# Patient Record
Sex: Male | Born: 1965
Health system: Southern US, Community
[De-identification: ages and names within clinical notes are randomized; demographics above are authoritative.]

## PROBLEM LIST (undated history)

## (undated) DIAGNOSIS — K219 Gastro-esophageal reflux disease without esophagitis: Secondary | ICD-10-CM

## (undated) DIAGNOSIS — Z8669 Personal history of other diseases of the nervous system and sense organs: Secondary | ICD-10-CM

## (undated) DIAGNOSIS — R519 Headache, unspecified: Secondary | ICD-10-CM

## (undated) DIAGNOSIS — R51 Headache: Principal | ICD-10-CM

## (undated) DIAGNOSIS — R011 Cardiac murmur, unspecified: Secondary | ICD-10-CM

## (undated) DIAGNOSIS — Z87891 Personal history of nicotine dependence: Secondary | ICD-10-CM

## (undated) HISTORY — DX: Cardiac murmur, unspecified: R01.1

## (undated) HISTORY — DX: Personal history of nicotine dependence: Z87.891

## (undated) HISTORY — DX: Headache: R51

## (undated) HISTORY — DX: Gastro-esophageal reflux disease without esophagitis: K21.9

## (undated) HISTORY — DX: Personal history of other diseases of the nervous system and sense organs: Z86.69

## (undated) HISTORY — DX: Headache, unspecified: R51.9

## (undated) HISTORY — PX: WISDOM TOOTH EXTRACTION: SHX21

## (undated) HISTORY — PX: TONSILLECTOMY: SUR1361

---

## 2003-11-14 ENCOUNTER — Emergency Department (HOSPITAL_COMMUNITY): Admission: EM | Admit: 2003-11-14 | Discharge: 2003-11-14 | Payer: Self-pay | Admitting: Emergency Medicine

## 2003-11-27 ENCOUNTER — Emergency Department (HOSPITAL_COMMUNITY): Admission: EM | Admit: 2003-11-27 | Discharge: 2003-11-27 | Payer: Self-pay | Admitting: Emergency Medicine

## 2011-06-23 ENCOUNTER — Encounter: Payer: Self-pay | Admitting: Family Medicine

## 2011-06-23 ENCOUNTER — Ambulatory Visit (INDEPENDENT_AMBULATORY_CARE_PROVIDER_SITE_OTHER): Payer: BC Managed Care – PPO | Admitting: Family Medicine

## 2011-06-23 VITALS — BP 112/70 | HR 80 | Temp 98.1°F | Ht 64.0 in | Wt 125.5 lb

## 2011-06-23 DIAGNOSIS — R519 Headache, unspecified: Secondary | ICD-10-CM

## 2011-06-23 DIAGNOSIS — J3489 Other specified disorders of nose and nasal sinuses: Secondary | ICD-10-CM

## 2011-06-23 DIAGNOSIS — R51 Headache: Secondary | ICD-10-CM

## 2011-06-23 DIAGNOSIS — R0981 Nasal congestion: Secondary | ICD-10-CM | POA: Insufficient documentation

## 2011-06-23 DIAGNOSIS — F17200 Nicotine dependence, unspecified, uncomplicated: Secondary | ICD-10-CM

## 2011-06-23 DIAGNOSIS — Z87891 Personal history of nicotine dependence: Secondary | ICD-10-CM | POA: Insufficient documentation

## 2011-06-23 DIAGNOSIS — G43909 Migraine, unspecified, not intractable, without status migrainosus: Secondary | ICD-10-CM | POA: Insufficient documentation

## 2011-06-23 DIAGNOSIS — Z23 Encounter for immunization: Secondary | ICD-10-CM

## 2011-06-23 MED ORDER — FLUTICASONE PROPIONATE 50 MCG/ACT NA SUSP: 2.0000 | Freq: Every day | NASAL | Status: DC

## 2011-06-23 NOTE — Assessment & Plan Note (Signed)
Neurological exam benign today. Anticipate sinus congestion component. Start INS and monitor headaches. Possible allergic rhinitis component (cats, cockroach)

## 2011-06-23 NOTE — Assessment & Plan Note (Signed)
Discussed risks of smoking and encouraged cessation. Contemplative.

## 2011-06-23 NOTE — Patient Instructions (Addendum)
Tdap (tetanus) and flu shot today. Let me know if headaches returning.  I do think they have to do with sinuses and allergies. Start flonase daily to see if we can help with these symptoms. Let me know if questions or concerns. You need to quit smoking!  Look into quitlinenc.com resources. Return if headaches not improving or any concerns or if you want to discuss smoking cessation.

## 2011-06-23 NOTE — Progress Notes (Signed)
Subjective:    Patient ID: Darren Clark, male    DOB: August 30, 1965, 45 y.o.   MRN: [id number]  HPI CC: new pt establish  Longstanding h/o sinus problems.  Prior lived in [place], then recently moved into friend's house, cockroaches there.  Severe headaches initially which led to vomiting, seemed to happen on weekends only (spent more time at work on weekdays).  Associated with nausea/vomiting and photo/phonophobia.  This happened for 3-4 wkends.  Seen by chiropractor who prescribed amoxicillin and nasal spray for sinus pressure.  This has significantly improved.  Headache starts superior to nasal bridge, then travels bilateral frontal.  Described as dull ache.  May have had seldom migraines when growing up, in 48s.    + RN, mild congestion, some PNDrainage now improved.  No sneezing, itchy eyes and watery eyes.  Tried allegra D in past, caused worsening congestion so stopped.  No fevers/chills, vision changes, loss of vision, hearing changes, confusion, slurred speech, paresthesias, weakness.  No recent weight changes, NS.  No coughing.  Uses goody powders prn.  Smokes 2ppd, has smoked since age 67yo.  This is decrease from prior.  Preventative: No recent CPE Tetanus - possibly 1999?  Requests today Tdap. Flu - requests today.  Medications and allergies reviewed and updated in chart.  Past histories reviewed and updated if relevant as below. There is no problem list on file for this patient.  Past Medical History  Diagnosis Date  . Generalized headaches     worsening  . Heart murmur     history  . History of migraines   . Smoker    Past Surgical History  Procedure Date  . Tonsillectomy   . Wisdom tooth extraction    History  Substance Use Topics  . Smoking status: Current Everyday Smoker -- 2.0 packs/day    Types: Cigarettes  . Smokeless tobacco: Never Used  . Alcohol Use: No   Family History  Problem Relation Age of Onset  . Cancer Mother   . Stroke Father   .  Coronary artery disease Neg Hx   . Diabetes Neg Hx    No Known Allergies No current outpatient prescriptions on file prior to visit.   Review of Systems  Constitutional: Negative for fever, chills, activity change, appetite change, fatigue and unexpected weight change.  HENT: Negative for hearing loss and neck pain.   Eyes: Negative for visual disturbance.  Respiratory: Positive for cough. Negative for chest tightness, shortness of breath and wheezing.   Cardiovascular: Negative for chest pain, palpitations and leg swelling.  Gastrointestinal: Negative for nausea, vomiting, abdominal pain, diarrhea, constipation, blood in stool and abdominal distention.  Genitourinary: Negative for hematuria and difficulty urinating.  Musculoskeletal: Negative for myalgias and arthralgias.  Skin: Negative for rash.  Neurological: Positive for headaches. Negative for dizziness, seizures and syncope.  Hematological: Does not bruise/bleed easily.  Psychiatric/Behavioral: Negative for dysphoric mood. The patient is not nervous/anxious.       Objective:   Physical Exam  Nursing note and vitals reviewed. Constitutional: He is oriented to person, place, and time. He appears well-developed and well-nourished. No distress.  HENT:  Head: Normocephalic and atraumatic.  Right Ear: Hearing, tympanic membrane, external ear and ear canal normal.  Left Ear: Hearing, tympanic membrane, external ear and ear canal normal.  Nose: Mucosal edema present. No rhinorrhea. Right sinus exhibits no maxillary sinus tenderness and no frontal sinus tenderness. Left sinus exhibits no maxillary sinus tenderness and no frontal sinus tenderness.  Mouth/Throat: Uvula is  midline, oropharynx is clear and moist and mucous membranes are normal. No oropharyngeal exudate, posterior oropharyngeal edema, posterior oropharyngeal erythema or tonsillar abscesses.       L nare completely obstructed from deviated septum  Eyes: Conjunctivae and EOM  are normal. Pupils are equal, round, and reactive to light. No scleral icterus.  Neck: Normal range of motion. Neck supple.  Cardiovascular: Normal rate, regular rhythm, normal heart sounds and intact distal pulses.   No murmur heard. Pulses:      Radial pulses are 2+ on the right side, and 2+ on the left side.  Pulmonary/Chest: Effort normal and breath sounds normal. No respiratory distress. He has no wheezes. He has no rales.  Abdominal: Soft. Bowel sounds are normal. He exhibits no distension and no mass. There is no tenderness. There is no rebound and no guarding.  Musculoskeletal: Normal range of motion. He exhibits no edema.  Lymphadenopathy:    He has no cervical adenopathy.  Neurological: He is alert and oriented to person, place, and time. He has normal strength. No cranial nerve deficit or sensory deficit. Coordination and gait normal.       station and gait intact  Skin: Skin is warm and dry. No rash noted.  Psychiatric: He has a normal mood and affect. His behavior is normal. Judgment and thought content normal.      Assessment & Plan:

## 2011-06-23 NOTE — Assessment & Plan Note (Signed)
Significant left sided obstruction of nare, anticipate due to septal deviation. Pt not interested in surgery.   Treat with INS for now, update me if not improving or if decides for ENT eval.

## 2011-07-03 ENCOUNTER — Other Ambulatory Visit: Payer: Self-pay | Admitting: Family Medicine

## 2011-07-03 ENCOUNTER — Ambulatory Visit: Payer: Self-pay | Admitting: Internal Medicine

## 2011-07-03 DIAGNOSIS — Z0000 Encounter for general adult medical examination without abnormal findings: Secondary | ICD-10-CM

## 2011-07-09 ENCOUNTER — Other Ambulatory Visit (INDEPENDENT_AMBULATORY_CARE_PROVIDER_SITE_OTHER): Payer: BC Managed Care – PPO

## 2011-07-09 DIAGNOSIS — Z0000 Encounter for general adult medical examination without abnormal findings: Secondary | ICD-10-CM

## 2011-07-09 LAB — COMPREHENSIVE METABOLIC PANEL
AST: 19 U/L (ref 0–37)
Albumin: 4 g/dL (ref 3.5–5.2)
Chloride: 110 mEq/L (ref 96–112)
GFR: 124.92 mL/min (ref >60.00)
Total Bilirubin: 0.2 mg/dL — ABNORMAL LOW (ref 0.3–1.2)
Total Protein: 6.5 g/dL (ref 6.0–8.3)

## 2011-07-09 LAB — CBC WITH DIFFERENTIAL/PLATELET
Basophils Absolute: 0.1 10*3/uL (ref 0.0–0.1)
Hemoglobin: 12.9 g/dL — ABNORMAL LOW (ref 13.0–17.0)
Lymphs Abs: 2.4 10*3/uL (ref 0.7–4.0)
Monocytes Absolute: 0.3 10*3/uL (ref 0.1–1.0)
Monocytes Relative: 4.2 % (ref 3.0–12.0)
Neutro Abs: 4.8 10*3/uL (ref 1.4–7.7)
Neutrophils Relative %: 56.9 % (ref 43.0–77.0)
Platelets: 248 10*3/uL (ref 150.0–400.0)

## 2011-07-09 LAB — LIPID PANEL
HDL: 36.6 mg/dL — ABNORMAL LOW (ref >39.00)
VLDL: 41.2 mg/dL — ABNORMAL HIGH (ref 0.0–40.0)

## 2011-07-10 LAB — LDL CHOLESTEROL, DIRECT: Direct LDL: 102.4 mg/dL

## 2011-07-14 ENCOUNTER — Encounter: Payer: Self-pay | Admitting: Radiology

## 2012-01-26 ENCOUNTER — Emergency Department (INDEPENDENT_AMBULATORY_CARE_PROVIDER_SITE_OTHER)
Admission: EM | Admit: 2012-01-26 | Discharge: 2012-01-26 | Disposition: A | Discharge status: Home / Self Care | Payer: BC Managed Care – PPO | Source: Home / Self Care | Attending: Family Medicine | Admitting: Family Medicine

## 2012-01-26 ENCOUNTER — Encounter (HOSPITAL_COMMUNITY): Payer: Self-pay

## 2012-01-26 DIAGNOSIS — J019 Acute sinusitis, unspecified: Secondary | ICD-10-CM

## 2012-01-26 MED ORDER — ACETAMINOPHEN-CODEINE #3 300-30 MG PO TABS: 1.0000 | ORAL_TABLET | Freq: Four times a day (QID) | ORAL | Status: AC | PRN

## 2012-01-26 MED ORDER — FLUTICASONE PROPIONATE 50 MCG/ACT NA SUSP: 2.0000 | Freq: Every day | NASAL | Status: DC

## 2012-01-26 MED ORDER — AMOXICILLIN 500 MG PO CAPS: 500.0000 mg | ORAL_CAPSULE | Freq: Three times a day (TID) | ORAL | Status: AC

## 2012-01-26 MED ORDER — PREDNISONE 20 MG PO TABS: ORAL_TABLET | ORAL | Status: AC

## 2012-01-26 MED ORDER — IBUPROFEN 600 MG PO TABS: 600.0000 mg | ORAL_TABLET | Freq: Three times a day (TID) | ORAL | Status: AC | PRN

## 2012-01-26 NOTE — ED Provider Notes (Signed)
History     CSN: [id number]  Arrival date & time 01/26/12  1357   First MD Initiated Contact with Patient 01/26/12 1418      Chief Complaint  Patient presents with  . Facial Pain    (Consider location/radiation/quality/duration/timing/severity/associated sxs/prior treatment) HPI Comments: 46 year old smoker male with history of recurrent sinusitis. Here complaining of nasal congestion, sinus pressure and headache for 1 week. Nasal secretions started to turn yellow in the last 2 days. Denies cough, shortness of breath, chest pain, fever or chills. Has used Flonase in the past. Has not been seen by ENT specialist in the past.   Past Medical History  Diagnosis Date  . Generalized headaches   . Heart murmur     history  . History of migraines   . Smoker     Past Surgical History  Procedure Date  . Tonsillectomy   . Wisdom tooth extraction     Family History  Problem Relation Age of Onset  . Cancer Mother   . Stroke Father   . Coronary artery disease Neg Hx   . Diabetes Neg Hx     History  Substance Use Topics  . Smoking status: Current Everyday Smoker -- 2.0 packs/day    Types: Cigarettes  . Smokeless tobacco: Never Used  . Alcohol Use: No      Review of Systems  Constitutional: Positive for appetite change. Negative for fever and chills.  HENT: Positive for congestion, rhinorrhea and sinus pressure. Negative for sore throat, facial swelling, trouble swallowing, neck pain and dental problem.   Respiratory: Negative for cough, shortness of breath and wheezing.   Cardiovascular: Negative for chest pain, palpitations and leg swelling.  Skin: Negative for rash.  Neurological: Positive for headaches.    Allergies  Review of patient's allergies indicates no known allergies.  Home Medications   Current Outpatient Rx  Name Route Sig Dispense Refill  . ACETAMINOPHEN-CODEINE #3 300-30 MG PO TABS Oral Take 1-2 tablets by mouth every 6 (six) hours as needed for pain.  15 tablet 0  . AMOXICILLIN 500 MG PO CAPS Oral Take 1 capsule (500 mg total) by mouth 3 (three) times daily. 30 capsule 0  . GOODY HEADACHE PO Oral Take by mouth as needed.      Marland Kitchen FLUTICASONE PROPIONATE 50 MCG/ACT NA SUSP Nasal Place 2 sprays into the nose daily. 16 g 0  . IBUPROFEN 600 MG PO TABS Oral Take 1 tablet (600 mg total) by mouth every 8 (eight) hours as needed for pain. Take with food 20 tablet 0  . PREDNISONE 20 MG PO TABS  2 tabs by mouth daily for 5 days 10 tablet 0    BP 127/83  Pulse 76  Temp 97.8 F (36.6 C) (Oral)  Resp 16  SpO2 98%  Physical Exam  Nursing note and vitals reviewed. Constitutional: He is oriented to person, place, and time. He appears well-developed and well-nourished. No distress.  HENT:  Head: Normocephalic and atraumatic.  Right Ear: External ear normal.  Left Ear: External ear normal.       Nasal Congestion with erythema and swelling of nasal turbinates, septal deviation with narrowing and almost complete occlussion of left nasal passage. Yellow nasal discharge. Reported sinus tenderness in both maxillary areas. mild pharyngeal erythema no exudates. No uvula deviation. No trismus. TM's normal.   Eyes: Conjunctivae and EOM are normal. Pupils are equal, round, and reactive to light.  Neck: Normal range of motion. Neck supple.  Cardiovascular: Normal rate,  regular rhythm and normal heart sounds.   Pulmonary/Chest: Effort normal and breath sounds normal. He has no wheezes. He has no rales.  Lymphadenopathy:    He has no cervical adenopathy.  Neurological: He is alert and oriented to person, place, and time.  Skin: No rash noted.    ED Course  Procedures (including critical care time)  Labs Reviewed - No data to display No results found.   1. Sinusitis acute       MDM  Treated with prednisone, amoxicillin, ibuprofen and Tylenol No. 3. Refilled Flonase. ENT referral as needed.        Sharin Grave, MD 01/28/12 [phone]

## 2012-01-26 NOTE — ED Notes (Signed)
C/o "another sinus infection"; facial pain and pressure

## 2012-07-21 DIAGNOSIS — Z87891 Personal history of nicotine dependence: Secondary | ICD-10-CM

## 2012-07-21 HISTORY — DX: Personal history of nicotine dependence: Z87.891

## 2012-08-03 ENCOUNTER — Ambulatory Visit (INDEPENDENT_AMBULATORY_CARE_PROVIDER_SITE_OTHER): Payer: BC Managed Care – PPO | Admitting: Family Medicine

## 2012-08-03 ENCOUNTER — Encounter: Payer: Self-pay | Admitting: Family Medicine

## 2012-08-03 ENCOUNTER — Ambulatory Visit: Payer: BC Managed Care – PPO | Admitting: Family Medicine

## 2012-08-03 VITALS — BP 110/70 | HR 97 | Temp 98.1°F | Wt 134.0 lb

## 2012-08-03 DIAGNOSIS — J111 Influenza due to unidentified influenza virus with other respiratory manifestations: Secondary | ICD-10-CM | POA: Insufficient documentation

## 2012-08-03 MED ORDER — GUAIFENESIN-CODEINE 100-10 MG/5ML PO SYRP: 5.0000 mL | ORAL_SOLUTION | Freq: Every evening | ORAL | Status: DC | PRN

## 2012-08-03 NOTE — Patient Instructions (Signed)
Out of work until fever free for 24 hours. I do think you had flu. Treat with cough medicine for night time and lots of fluid and rest. Watch for fever >101 or worsening productive cough.  Influenza, Adult Influenza ("the flu") is a viral infection of the respiratory tract. It occurs more often in winter months because people spend more time in close contact with one another. Influenza can make you feel very sick. Influenza easily spreads from person to person (contagious). CAUSES  Influenza is caused by a virus that infects the respiratory tract. You can catch the virus by breathing in droplets from an infected person's cough or sneeze. You can also catch the virus by touching something that was recently contaminated with the virus and then touching your mouth, nose, or eyes. SYMPTOMS  Symptoms typically last 4 to 10 days and may include:  Fever.  Chills.  Headache, body aches, and muscle aches.  Sore throat.  Chest discomfort and cough.  Poor appetite.  Weakness or feeling tired.  Dizziness.  Nausea or vomiting. DIAGNOSIS  Diagnosis of influenza is often made based on your history and a physical exam. A nose or throat swab test can be done to confirm the diagnosis. RISKS AND COMPLICATIONS You may be at risk for a more severe case of influenza if you smoke cigarettes, have diabetes, have chronic heart disease (such as heart failure) or lung disease (such as asthma), or if you have a weakened immune system. Elderly people and pregnant women are also at risk for more serious infections. The most common complication of influenza is a lung infection (pneumonia). Sometimes, this complication can require emergency medical care and may be life-threatening. PREVENTION  An annual influenza vaccination (flu shot) is the best way to avoid getting influenza. An annual flu shot is now routinely recommended for all adults in the [place] TREATMENT  In mild cases, influenza goes away on its own.  Treatment is directed at relieving symptoms. For more severe cases, your caregiver may prescribe antiviral medicines to shorten the sickness. Antibiotic medicines are not effective, because the infection is caused by a virus, not by bacteria. HOME CARE INSTRUCTIONS  Only take over-the-counter or prescription medicines for pain, discomfort, or fever as directed by your caregiver.  Use a cool mist humidifier to make breathing easier.  Get plenty of rest until your temperature returns to normal. This usually takes 3 to 4 days.  Drink enough fluids to keep your urine clear or pale yellow.  Cover your mouth and nose when coughing or sneezing, and wash your hands well to avoid spreading the virus.  Stay home from work or school until your fever has been gone for at least 1 full day. SEEK MEDICAL CARE IF:   You have chest pain or a deep cough that worsens or produces more mucus.  You have nausea, vomiting, or diarrhea. SEEK IMMEDIATE MEDICAL CARE IF:   You have difficulty breathing, shortness of breath, or your skin or nails turn bluish.  You have severe neck pain or stiffness.  You have a severe headache, facial pain, or earache.  You have a worsening or recurring fever.  You have nausea or vomiting that cannot be controlled. MAKE SURE YOU:  Understand these instructions.  Will watch your condition.  Will get help right away if you are not doing well or get worse. Document Released: 07/04/2000 Document Revised: 01/06/2012 Document Reviewed: 10/06/2011 [facility] Patient Information 2013 [place], [place].

## 2012-08-03 NOTE — Assessment & Plan Note (Signed)
Supportive care as per instructions. Outside of window for tamiflu. See pt instructions for symptomatic care. Discussed importance of smoking cessation and concern for development of COPD with extensive smoking hx.

## 2012-08-03 NOTE — Progress Notes (Signed)
  Subjective:    Patient ID: Darren Clark, male    DOB: [birth year], 47 y.o.   MRN: [id number]  HPI CC: flu sxs  5d h/o cold sxs that started with sudden onset chills that progressed to sweating, fever, loss of appetite, body aches, arthralgias, and sore throat.  + cough present, dry.  Emesis x 1.  Some hoarse voice as well.  Yesterday started feeling better.  Cough improving as well.  No sick contacts at home.   No h/o asthma. Smoking - 2 ppd.  Cutting back.  So far has tried [facility] powders. No flu shot this year.  Past Medical History  Diagnosis Date  . Generalized headaches   . Heart murmur     history  . History of migraines   . Smoker      Review of Systems Per HPI    Objective:   Physical Exam  Nursing note and vitals reviewed. Constitutional: He appears well-developed and well-nourished. No distress.  HENT:  Head: Normocephalic and atraumatic.  Right Ear: Hearing, tympanic membrane, external ear and ear canal normal.  Left Ear: Hearing, tympanic membrane, external ear and ear canal normal.  Nose: Mucosal edema (L swollen mucosa) present. No rhinorrhea. Right sinus exhibits no maxillary sinus tenderness and no frontal sinus tenderness. Left sinus exhibits no maxillary sinus tenderness and no frontal sinus tenderness.  Mouth/Throat: Uvula is midline, oropharynx is clear and moist and mucous membranes are normal. No oropharyngeal exudate, posterior oropharyngeal edema, posterior oropharyngeal erythema or tonsillar abscesses.  Eyes: Conjunctivae normal and EOM are normal. Pupils are equal, round, and reactive to light. No scleral icterus.  Neck: Normal range of motion. Neck supple.  Cardiovascular: Normal rate, regular rhythm, normal heart sounds and intact distal pulses.   No murmur heard. Pulmonary/Chest: Effort normal. No respiratory distress. He has wheezes (faint exp wheezing). He has no rales.       Hacking cough present  Lymphadenopathy:    He has no cervical  adenopathy.  Skin: Skin is warm and dry. No rash noted.       Assessment & Plan:

## 2012-11-29 ENCOUNTER — Encounter: Payer: Self-pay | Admitting: Internal Medicine

## 2012-11-29 ENCOUNTER — Ambulatory Visit (INDEPENDENT_AMBULATORY_CARE_PROVIDER_SITE_OTHER): Payer: BC Managed Care – PPO | Admitting: Internal Medicine

## 2012-11-29 VITALS — BP 112/70 | HR 71 | Temp 98.4°F | Wt 139.0 lb

## 2012-11-29 DIAGNOSIS — B9689 Other specified bacterial agents as the cause of diseases classified elsewhere: Secondary | ICD-10-CM | POA: Insufficient documentation

## 2012-11-29 DIAGNOSIS — J019 Acute sinusitis, unspecified: Secondary | ICD-10-CM

## 2012-11-29 MED ORDER — AMOXICILLIN 500 MG PO TABS: 1000.0000 mg | ORAL_TABLET | Freq: Two times a day (BID) | ORAL | Status: DC

## 2012-11-29 MED ORDER — FLUTICASONE PROPIONATE 50 MCG/ACT NA SUSP: 2.0000 | Freq: Every day | NASAL | Status: DC

## 2012-11-29 NOTE — Progress Notes (Signed)
  Subjective:    Patient ID: Darren Clark, male    DOB: 1965/10/22, 47 y.o.   MRN: [id number]  HPI Thinks he has a sinus infection Congestion and headache--frontal Like last time but trying to catch it sooner Trying OTC allergy meds---fexofenadine Symptoms started about 1 week ago  Has PND Not much cough No ear pain  Slight sore throat No fever or SOB  Current Outpatient Prescriptions on File Prior to Visit  Medication Sig Dispense Refill  . Aspirin-Acetaminophen-Caffeine (GOODY HEADACHE PO) Take by mouth as needed.         No current facility-administered medications on file prior to visit.    No Known Allergies  Past Medical History  Diagnosis Date  . Generalized headaches   . Heart murmur     history  . History of migraines   . Smoker     Past Surgical History  Procedure Laterality Date  . Tonsillectomy    . Wisdom tooth extraction      Family History  Problem Relation Age of Onset  . Cancer Mother   . Stroke Father   . Coronary artery disease Neg Hx   . Diabetes Neg Hx     History   Social History  . Marital Status: Single    Spouse Name: N/A    Number of Children: N/A  . Years of Education: N/A   Occupational History  . Not on file.   Social History Main Topics  . Smoking status: Former Smoker -- 2.00 packs/day    Types: Cigarettes    Quit date: 10/30/2012  . Smokeless tobacco: Never Used  . Alcohol Use: No  . Drug Use: No  . Sexually Active: Not on file   Other Topics Concern  . Not on file   Social History Narrative   Caffeine: cheer wine soda 2 24 oz bottles/day   Remote MJ, cocaine, EtOH use   Lives with friends, several cats, 2 dogs   Occupation: Retail banker   Activity: walks dog some   Diet: rare fruits/vegetables, lots of eating out, rare water   Review of Systems No rash No vomiting or diarrhea  Appetite is okay     Objective:   Physical Exam  Constitutional: He appears well-nourished. No distress.  HENT:   Mouth/Throat: Oropharynx is clear and moist. No oropharyngeal exudate.  No sinus tenderness Moderate nasal congestion TMs normal  Neck: Normal range of motion. Neck supple.  Non tender anterior cervical nodes  Pulmonary/Chest: Effort normal. No respiratory distress. He has no wheezes. He has no rales.  Slightly decreased breath sounds at the bases  Lymphadenopathy:    He has cervical adenopathy.          Assessment & Plan:

## 2012-11-29 NOTE — Assessment & Plan Note (Signed)
Hard to tell if bacterial or not He has been sick for a week Will start amoxil since he got really sick last time and he does have nodes Refill fluticasone

## 2012-12-24 ENCOUNTER — Other Ambulatory Visit: Payer: Self-pay | Admitting: *Deleted

## 2012-12-24 MED ORDER — FLUTICASONE PROPIONATE 50 MCG/ACT NA SUSP: 2.0000 | Freq: Every day | NASAL | Status: DC

## 2013-05-20 ENCOUNTER — Encounter: Payer: Self-pay | Admitting: Family Medicine

## 2013-05-20 ENCOUNTER — Ambulatory Visit (INDEPENDENT_AMBULATORY_CARE_PROVIDER_SITE_OTHER): Payer: BC Managed Care – PPO | Admitting: Family Medicine

## 2013-05-20 VITALS — BP 136/88 | HR 60 | Temp 97.8°F | Wt 145.5 lb

## 2013-05-20 DIAGNOSIS — F17200 Nicotine dependence, unspecified, uncomplicated: Secondary | ICD-10-CM

## 2013-05-20 DIAGNOSIS — R0981 Nasal congestion: Secondary | ICD-10-CM

## 2013-05-20 DIAGNOSIS — J3489 Other specified disorders of nose and nasal sinuses: Secondary | ICD-10-CM

## 2013-05-20 DIAGNOSIS — M7711 Lateral epicondylitis, right elbow: Secondary | ICD-10-CM | POA: Insufficient documentation

## 2013-05-20 DIAGNOSIS — M7710 Lateral epicondylitis, unspecified elbow: Secondary | ICD-10-CM

## 2013-05-20 MED ORDER — NAPROXEN 500 MG PO TABS: ORAL_TABLET | ORAL | Status: DC

## 2013-05-20 MED ORDER — MOMETASONE FUROATE 50 MCG/ACT NA SUSP: 2.0000 | Freq: Every day | NASAL | Status: DC

## 2013-05-20 NOTE — Assessment & Plan Note (Signed)
Anticipate septal deviation contributing. ?flonase causing headache so will do trial of nasonex. If not better, recommended ENT eval. ?polyp on right side.

## 2013-05-20 NOTE — Progress Notes (Signed)
  Subjective:    Patient ID: Darren Clark, male    DOB: 08-13-1965, 47 y.o.   MRN: [id number]  HPI CC: sinus infection? And R elbow pain  Chronic nasal congestion.  sxs going on for months.  Persistent rhinorrhea, nasal congestion.  Worse when in air conditioning.  Intermittent mild pressure headache behind eyes.  Left nose stays closed up. Overall improved but has persisted some  No fevers/chills, ear or tooth pain, ST, PNdrainage.  Stopped flonase because caused a bad headache once.  So far has tried [facility] powders.  Also tried excedrin migraine which seems to help. Known significant nasal deviation to left.  Had declined ENT eval in past.  R elbow - lateral sided pain worse over last 3 weeks.  Started after he lifted 30lb cell over metal grate.  Repetitive motions at work.  Past Medical History  Diagnosis Date  . Generalized headaches   . Heart murmur     history  . History of migraines   . Smoker      Review of Systems Per HPI    Objective:   Physical Exam  Nursing note and vitals reviewed. Constitutional: He appears well-developed and well-nourished. No distress.  HENT:  Head: Normocephalic and atraumatic.  Right Ear: Hearing, tympanic membrane, external ear and ear canal normal.  Left Ear: Hearing, tympanic membrane, external ear and ear canal normal.  Nose: Mucosal edema and septal deviation (to left with blockage of nasal passage on left) present. No rhinorrhea. Right sinus exhibits no maxillary sinus tenderness and no frontal sinus tenderness. Left sinus exhibits no maxillary sinus tenderness and no frontal sinus tenderness.  Mouth/Throat: Uvula is midline, oropharynx is clear and moist and mucous membranes are normal. No oropharyngeal exudate, posterior oropharyngeal edema, posterior oropharyngeal erythema or tonsillar abscesses.  ? Polypoid tissue lateral right nose  Eyes: Conjunctivae and EOM are normal. Pupils are equal, round, and reactive to light. No scleral  icterus.  Neck: Normal range of motion. Neck supple.  Cardiovascular: Normal rate, regular rhythm, normal heart sounds and intact distal pulses.   No murmur heard. Pulmonary/Chest: Effort normal and breath sounds normal. No respiratory distress. He has no wheezes. He has no rales.  Musculoskeletal: He exhibits no edema.  FROM at elbow in flexion/extension. Tender to palpation at right lateral epicondyle No pain at olecranon bursa Tender with forced pronation, tender with extension against resistance  Lymphadenopathy:    He has no cervical adenopathy.  Skin: Skin is warm and dry. No rash noted.       Assessment & Plan:

## 2013-05-20 NOTE — Assessment & Plan Note (Signed)
Discussed etiology and treatment plna. Provided with stretching exercises. Treat with NSAID Update if not better with this.

## 2013-05-20 NOTE — Assessment & Plan Note (Signed)
Congratulated on quitting smoking Continues with e cig.

## 2013-05-20 NOTE — Patient Instructions (Signed)
No evidence of infection today. Let's do trial of naprosyn prescription strength for tennis elbow - take with food.  Stretching exercises provided as well.  Continue tennis elbow band For nose - you have persistent septal deviation.  Let's try second nasal steroid (nasonex) sent into pharmacy.  If this doesn't help, consider ENT referral and let us know. Good to see you today, call us with quesitons.

## 2013-06-06 ENCOUNTER — Other Ambulatory Visit: Payer: Self-pay | Admitting: Family Medicine

## 2013-07-21 HISTORY — PX: NASAL SINUS SURGERY: SHX719

## 2014-09-28 ENCOUNTER — Ambulatory Visit (INDEPENDENT_AMBULATORY_CARE_PROVIDER_SITE_OTHER): Payer: BLUE CROSS/BLUE SHIELD | Admitting: Family Medicine

## 2014-09-28 ENCOUNTER — Encounter: Payer: Self-pay | Admitting: Family Medicine

## 2014-09-28 VITALS — BP 124/82 | HR 84 | Temp 98.0°F | Wt 155.8 lb

## 2014-09-28 DIAGNOSIS — Z87891 Personal history of nicotine dependence: Secondary | ICD-10-CM

## 2014-09-28 DIAGNOSIS — K219 Gastro-esophageal reflux disease without esophagitis: Secondary | ICD-10-CM

## 2014-09-28 DIAGNOSIS — R1011 Right upper quadrant pain: Secondary | ICD-10-CM

## 2014-09-28 LAB — CBC WITH DIFFERENTIAL/PLATELET
BASOS PCT: 0.6 % (ref 0.0–3.0)
Basophils Absolute: 0 10*3/uL (ref 0.0–0.1)
EOS ABS: 0.5 10*3/uL (ref 0.0–0.7)
Eosinophils Relative: 6 % — ABNORMAL HIGH (ref 0.0–5.0)
HCT: 38.2 % — ABNORMAL LOW (ref 39.0–52.0)
Hemoglobin: 12.8 g/dL — ABNORMAL LOW (ref 13.0–17.0)
LYMPHS ABS: 2.2 10*3/uL (ref 0.7–4.0)
Lymphocytes Relative: 27.7 % (ref 12.0–46.0)
MCHC: 33.4 g/dL (ref 30.0–36.0)
MCV: 82.4 fl (ref 78.0–100.0)
MONO ABS: 0.7 10*3/uL (ref 0.1–1.0)
MONOS PCT: 8.6 % (ref 3.0–12.0)
NEUTROS ABS: 4.5 10*3/uL (ref 1.4–7.7)
Neutrophils Relative %: 57.1 % (ref 43.0–77.0)
Platelets: 270 10*3/uL (ref 150.0–400.0)
RBC: 4.64 Mil/uL (ref 4.22–5.81)
RDW: 15.2 % (ref 11.5–15.5)
WBC: 7.8 10*3/uL (ref 4.0–10.5)

## 2014-09-28 LAB — COMPREHENSIVE METABOLIC PANEL
ALT: 17 U/L (ref 0–53)
AST: 15 U/L (ref 0–37)
Albumin: 4.4 g/dL (ref 3.5–5.2)
Alkaline Phosphatase: 100 U/L (ref 39–117)
BILIRUBIN TOTAL: 0.3 mg/dL (ref 0.2–1.2)
BUN: 16 mg/dL (ref 6–23)
CO2: 31 mEq/L (ref 19–32)
Calcium: 10 mg/dL (ref 8.4–10.5)
Chloride: 105 mEq/L (ref 96–112)
Creatinine, Ser: 0.84 mg/dL (ref 0.40–1.50)
GFR: 103.13 mL/min (ref >60.00)
Glucose, Bld: 88 mg/dL (ref 70–99)
Potassium: 4.3 mEq/L (ref 3.5–5.1)
SODIUM: 139 meq/L (ref 135–145)
Total Protein: 7.4 g/dL (ref 6.0–8.3)

## 2014-09-28 LAB — LIPASE: LIPASE: 4 U/L — AB (ref 11.0–59.0)

## 2014-09-28 MED ORDER — OMEPRAZOLE 40 MG PO CPDR: 40.0000 mg | DELAYED_RELEASE_CAPSULE | Freq: Two times a day (BID) | ORAL | Status: DC

## 2014-09-28 MED ORDER — SUCRALFATE 1 GM/10ML PO SUSP: 1.0000 g | Freq: Three times a day (TID) | ORAL | Status: DC

## 2014-09-28 NOTE — Assessment & Plan Note (Signed)
Continues e cig.

## 2014-09-28 NOTE — Assessment & Plan Note (Signed)
Increase omeprazole to 40mg  daily as well as start carafate for next few weeks.

## 2014-09-28 NOTE — Patient Instructions (Addendum)
Stop anti inflammatories - aleve, ibuprofen, advil, motrin. Tylenol ok.  labwork today to check liver and gallblader. I think this is heartburn with irritation of stomach and possibly ulcer related. Treat with increased dose of prilosec to 40mg  twice daily (omeprazole) and start carafate liquid with each meal. Take both for 3 weeks, may continue omeprazole if helpful. If no better in 2-3 wks or worsening call me for abdominal ultrasound and referral to GI doctor.   Peptic Ulcer A peptic ulcer is a sore in the lining of your esophagus (esophageal ulcer), stomach (gastric ulcer), or in the first part of your small intestine (duodenal ulcer). The ulcer causes erosion into the deeper tissue. CAUSES  Normally, the lining of the stomach and the small intestine protects itself from the acid that digests food. The protective lining can be damaged by:  An infection caused by a bacterium called Helicobacter pylori (H. pylori).  Regular use of nonsteroidal anti-inflammatory drugs (NSAIDs), such as ibuprofen or aspirin.  Smoking tobacco. Other risk factors include being older than 50, drinking alcohol excessively, and having a family history of ulcer disease.  SYMPTOMS   Burning pain or gnawing in the area between the chest and the belly button.  Heartburn.  Nausea and vomiting.  Bloating. The pain can be worse on an empty stomach and at night. If the ulcer results in bleeding, it can cause:  Black, tarry stools.  Vomiting of bright red blood.  Vomiting of coffee-ground-looking materials. DIAGNOSIS  A diagnosis is usually made based upon your history and an exam. Other tests and procedures may be performed to find the cause of the ulcer. Finding a cause will help determine the best treatment. Tests and procedures may include:  Blood tests, stool tests, or breath tests to check for the bacterium H. pylori.  An upper gastrointestinal (GI) series of the esophagus, stomach, and small  intestine.  An endoscopy to examine the esophagus, stomach, and small intestine.  A biopsy. TREATMENT  Treatment may include:  Eliminating the cause of the ulcer, such as smoking, NSAIDs, or alcohol.  Medicines to reduce the amount of acid in your digestive tract.  Antibiotic medicines if the ulcer is caused by the H. pylori bacterium.  An upper endoscopy to treat a bleeding ulcer.  Surgery if the bleeding is severe or if the ulcer created a hole somewhere in the digestive system. HOME CARE INSTRUCTIONS   Avoid tobacco, alcohol, and caffeine. Smoking can increase the acid in the stomach, and continued smoking will impair the healing of ulcers.  Avoid foods and drinks that seem to cause discomfort or aggravate your ulcer.  Only take medicines as directed by your caregiver. Do not substitute over-the-counter medicines for prescription medicines without talking to your caregiver.  Keep any follow-up appointments and tests as directed. SEEK MEDICAL CARE IF:   Your do not improve within 7 days of starting treatment.  You have ongoing indigestion or heartburn. SEEK IMMEDIATE MEDICAL CARE IF:   You have sudden, sharp, or persistent abdominal pain.  You have bloody or dark black, tarry stools.  You vomit blood or vomit that looks like coffee grounds.  You become light-headed, weak, or feel faint.  You become sweaty or clammy. MAKE SURE YOU:   Understand these instructions.  Will watch your condition.  Will get help right away if you are not doing well or get worse. Document Released: 07/04/2000 Document Revised: 11/21/2013 Document Reviewed: 02/04/2012 [facility] Patient Information 2015 [place], [place]. This information is not  intended to replace advice given to you by your health care provider. Make sure you discuss any questions you have with your health care provider.

## 2014-09-28 NOTE — Progress Notes (Signed)
Pre visit review using our clinic review tool, if applicable. No additional management support is needed unless otherwise documented below in the visit note. 

## 2014-09-28 NOTE — Assessment & Plan Note (Signed)
Anticipate largely related to GERD and possible gastritis/ulcer given associated vomiting. Check labs today. Treat with omeprazole 40mg  daily and carafate daily for 3 wks.  Update if not improved for abd [place] and GI referral to discuss possible EGD. Pt agrees with plan.

## 2014-09-28 NOTE — Progress Notes (Signed)
BP 124/82 mmHg  Pulse 84  Temp(Src) 98 F (36.7 C) (Oral)  Wt 155 lb 12 oz (70.648 kg)   CC: abd pain  Subjective:    Patient ID: Darren Clark, male    DOB: [birth year], 49 y.o.   MRN: [id number]  HPI: Darren Clark is a 49 y.o. male presenting on 09/28/2014 for Abdominal Pain   Not seen since 04/2013. Since last seen had sinus surgery by ENT in [place].  Presents today with concern over intermittent RUQ pain. Ongoing over last several months. Describes cramping or stabbing pain that improves with rubbing lower ribcage. Yesterday bad GERD that led to vomiting. Yesterday ate chicken wings and brownies. Decreased appetite.   Denies fevers/chills, jaundice, dysphagia, odynophagia, early satiety. Denies nausea, diarrhea or constipation or blood in stool.  Does take aleve regularly.  No alcohol. Does drink sodas daily.  He does take prilosec 20mg  twice daily.   Relevant past medical, surgical, family and social history reviewed and updated as indicated. Interim medical history since our last visit reviewed. Allergies and medications reviewed and updated. No current outpatient prescriptions on file prior to visit.   No current facility-administered medications on file prior to visit.    Review of Systems Per HPI unless specifically indicated above     Objective:    BP 124/82 mmHg  Pulse 84  Temp(Src) 98 F (36.7 C) (Oral)  Wt 155 lb 12 oz (70.648 kg)  Wt Readings from Last 3 Encounters:  09/28/14 155 lb 12 oz (70.648 kg)  05/20/13 145 lb 8 oz (65.998 kg)  11/29/12 139 lb (63.05 kg)    Physical Exam  Constitutional: He appears well-developed and well-nourished. No distress.  HENT:  Head: Normocephalic and atraumatic.  Mouth/Throat: Oropharynx is clear and moist. No oropharyngeal exudate.  Eyes: Conjunctivae and EOM are normal. Pupils are equal, round, and reactive to light. No scleral icterus.  Neck: Normal range of motion. Neck supple. No thyromegaly present.    Cardiovascular: Normal rate, regular rhythm, normal heart sounds and intact distal pulses.   No murmur heard. Pulmonary/Chest: Effort normal. No respiratory distress. He has no wheezes. He has no rales.  Abdominal: Soft. Normal appearance and bowel sounds are normal. He exhibits no distension and no mass. There is no hepatosplenomegaly. There is no tenderness. There is no rigidity, no rebound, no guarding, no CVA tenderness and negative Murphy's sign.  Mild discomfort to palpation right lower ribcage  Musculoskeletal: He exhibits no edema.  Lymphadenopathy:    He has no cervical adenopathy.  Skin: Skin is warm and dry. No rash noted.  Psychiatric: He has a normal mood and affect.  Nursing note and vitals reviewed.      Assessment & Plan:   Problem List Items Addressed This Visit    RUQ abdominal pain - Primary    Anticipate largely related to GERD and possible gastritis/ulcer given associated vomiting. Check labs today. Treat with omeprazole 40mg  daily and carafate daily for 3 wks.  Update if not improved for abd [place] and GI referral to discuss possible EGD. Pt agrees with plan.      GERD (gastroesophageal reflux disease)    Increase omeprazole to 40mg  daily as well as start carafate for next few weeks.      Relevant Medications   CARAFATE 1 GM/10ML PO SUSP   omeprazole (PRILOSEC) capsule   Ex-smoker    Continues e cig.          Follow up plan: Return if symptoms worsen  or fail to improve.

## 2014-09-29 ENCOUNTER — Encounter: Payer: Self-pay | Admitting: *Deleted

## 2014-11-22 ENCOUNTER — Other Ambulatory Visit: Payer: Self-pay | Admitting: Family Medicine

## 2014-11-25 ENCOUNTER — Other Ambulatory Visit: Payer: Self-pay | Admitting: Family Medicine

## 2014-12-19 ENCOUNTER — Ambulatory Visit (INDEPENDENT_AMBULATORY_CARE_PROVIDER_SITE_OTHER): Payer: BLUE CROSS/BLUE SHIELD | Admitting: Family Medicine

## 2014-12-19 ENCOUNTER — Encounter: Payer: Self-pay | Admitting: *Deleted

## 2014-12-19 ENCOUNTER — Encounter: Payer: Self-pay | Admitting: Family Medicine

## 2014-12-19 VITALS — BP 118/76 | HR 104 | Temp 98.2°F | Wt 155.8 lb

## 2014-12-19 DIAGNOSIS — R51 Headache: Secondary | ICD-10-CM

## 2014-12-19 DIAGNOSIS — R590 Localized enlarged lymph nodes: Secondary | ICD-10-CM | POA: Diagnosis not present

## 2014-12-19 DIAGNOSIS — R519 Headache, unspecified: Secondary | ICD-10-CM

## 2014-12-19 MED ORDER — SUMATRIPTAN SUCCINATE 50 MG PO TABS: 50.0000 mg | ORAL_TABLET | ORAL | Status: DC | PRN

## 2014-12-19 MED ORDER — AMOXICILLIN-POT CLAVULANATE 875-125 MG PO TABS: 1.0000 | ORAL_TABLET | Freq: Two times a day (BID) | ORAL | Status: AC

## 2014-12-19 NOTE — Progress Notes (Signed)
Pre visit review using our clinic review tool, if applicable. No additional management support is needed unless otherwise documented below in the visit note. 

## 2014-12-19 NOTE — Assessment & Plan Note (Signed)
New. ?sinusitis related - treat with 10d augmentin. Reassess in 1 mo f/u. Advised pt of importance to ensure LAD has resolved over next 3-4 wks. Advised he f/u in office at 4 wk mark for re eval of this and headaches.

## 2014-12-19 NOTE — Patient Instructions (Addendum)
Let's treat with augmentin 10 day course for possible sinus infection component.  If persistent headaches despite this, this may be your migraines returning - start new medicine called sumatriptan 50mg  at onset of headache. If migraine persists after 2 hours, may take second dose but no more than 2 doses each day. RTC 1 mo f/u visit.

## 2014-12-19 NOTE — Assessment & Plan Note (Signed)
Possible sinus infection component - start augmentin. Today describes sxs also possibly consistent with returning migraines. Discussed this.  If abx course does not resolve headaches, will trial sumatriptan. Chosen as less sedating option he can take at work. Discussed use and importance to take at onset of migraine. Discussed stop and notify us immediately if any chest pain. Discussed possible FMLA for migraines if persistent. RTC 1 mo f/u visit.

## 2014-12-19 NOTE — Progress Notes (Signed)
BP 118/76 mmHg  Pulse 104  Temp(Src) 98.2 F (36.8 C) (Oral)  Wt 155 lb 12 oz (70.648 kg)   CC: frequent headaches  Subjective:    Patient ID: Darren Clark, male    DOB: 04-Nov-1965, 49 y.o.   MRN: [id number]  HPI: Darren Clark is a 49 y.o. male presenting on 12/19/2014 for frequent headaches   Yesterday while watching remote controlled planes started having headache. Thought he was hungry - ate 1 hamburger and two hot dogs. Headache persisted despite eating. Later that night vomited all lunch back up. Has been having 1 bad headache per week. Describes pressure pain starting between eyes then radiation throughout entire head. Sometimes unilateral, sometimes bilateral. Denies significant photo/phonophobia, no aura. Wonders about sinusitis. + rhinorrhea. Did have nasal congestion 1.5 wks ago but that has largely resolved.  No significant fevers/chills, congestion, coughing.  Out of work several times in the last month - worried may lose his job.   Tylenol, ibuprofen hasn't helped. Aleve helps some but he avoids 2/2 GERD and presumed PUD last visit.  H/o migraines that improved after sinus surgery 2015. This feels somewhat similar. Thinks flonase may have caused migraine as well as plain nasal saline.  Only on e cigarettes. Denies alcohol, rec drugs.  Relevant past medical, surgical, family and social history reviewed and updated as indicated. Interim medical history since our last visit reviewed. Allergies and medications reviewed and updated. Current Outpatient Prescriptions on File Prior to Visit  Medication Sig  . omeprazole (PRILOSEC) 40 MG capsule TAKE 1 CAPSULE (40 MG TOTAL) BY MOUTH 2 (TWO) TIMES DAILY.   No current facility-administered medications on file prior to visit.    Review of Systems Per HPI unless specifically indicated above     Objective:    BP 118/76 mmHg  Pulse 104  Temp(Src) 98.2 F (36.8 C) (Oral)  Wt 155 lb 12 oz (70.648 kg)  Wt Readings from  Last 3 Encounters:  12/19/14 155 lb 12 oz (70.648 kg)  09/28/14 155 lb 12 oz (70.648 kg)  05/20/13 145 lb 8 oz (65.998 kg)    Physical Exam  Constitutional: He appears well-developed and well-nourished. No distress.  HENT:  Head: Normocephalic and atraumatic.  Right Ear: Hearing, tympanic membrane, external ear and ear Clark normal.  Left Ear: Hearing, tympanic membrane, external ear and ear Clark normal.  Nose: Nose normal. No mucosal edema or rhinorrhea. Right sinus exhibits no maxillary sinus tenderness and no frontal sinus tenderness. Left sinus exhibits no maxillary sinus tenderness and no frontal sinus tenderness.  Mouth/Throat: Uvula is midline, oropharynx is clear and moist and mucous membranes are normal. No oropharyngeal exudate, posterior oropharyngeal edema, posterior oropharyngeal erythema or tonsillar abscesses.  Eyes: Conjunctivae and EOM are normal. Pupils are equal, round, and reactive to light. No scleral icterus.  Neck: Normal range of motion. Neck supple. No thyromegaly present.  Cardiovascular: Normal rate, regular rhythm, normal heart sounds and intact distal pulses.   No murmur heard. Pulmonary/Chest: Effort normal and breath sounds normal. No respiratory distress. He has no wheezes. He has no rales.  Lymphadenopathy:       Head (right side): Submandibular (~1.5cm, tender) adenopathy present. No submental, no tonsillar, no preauricular and no posterior auricular adenopathy present.       Head (left side): No submental, no submandibular, no tonsillar, no preauricular and no posterior auricular adenopathy present.    He has no cervical adenopathy.  Neurological: No cranial nerve deficit or sensory deficit. Coordination and  gait normal.  CN 2-12 intact FTN intact  Skin: Skin is warm and dry. No rash noted.  Nursing note and vitals reviewed.      Assessment & Plan:   Problem List Items Addressed This Visit    Headache - Primary    Possible sinus infection  component - start augmentin. Today describes sxs also possibly consistent with returning migraines. Discussed this.  If abx course does not resolve headaches, will trial sumatriptan. Chosen as less sedating option he can take at work. Discussed use and importance to take at onset of migraine. Discussed stop and notify us immediately if any chest pain. Discussed possible FMLA for migraines if persistent. RTC 1 mo f/u visit.      Relevant Medications   acetaminophen (TYLENOL) 500 MG tablet   SUMAtriptan (IMITREX) 50 MG tablet   LAD (lymphadenopathy), submandibular    New. ?sinusitis related - treat with 10d augmentin. Reassess in 1 mo f/u. Advised pt of importance to ensure LAD has resolved over next 3-4 wks. Advised he f/u in office at 4 wk mark for re eval of this and headaches.          Follow up plan: Return in about 4 weeks (around 01/16/2015), or if symptoms worsen or fail to improve, for follow up visit.

## 2015-01-17 ENCOUNTER — Telehealth: Payer: Self-pay | Admitting: Family Medicine

## 2015-01-17 NOTE — Telephone Encounter (Signed)
Pt has appointment on Friday he wanted to know if he needed to come in or could he get a refill on sumatriptam Please advise pt   [facility] [place]

## 2015-01-17 NOTE — Telephone Encounter (Signed)
Spoke with patient and advised he needed to keep appt. He verbalized understanding.

## 2015-01-19 ENCOUNTER — Ambulatory Visit (INDEPENDENT_AMBULATORY_CARE_PROVIDER_SITE_OTHER): Payer: BLUE CROSS/BLUE SHIELD | Admitting: Family Medicine

## 2015-01-19 ENCOUNTER — Ambulatory Visit: Payer: BLUE CROSS/BLUE SHIELD | Admitting: Family Medicine

## 2015-01-19 ENCOUNTER — Encounter: Payer: Self-pay | Admitting: Family Medicine

## 2015-01-19 VITALS — BP 110/60 | HR 66 | Temp 98.0°F | Wt 156.0 lb

## 2015-01-19 DIAGNOSIS — G43009 Migraine without aura, not intractable, without status migrainosus: Secondary | ICD-10-CM | POA: Diagnosis not present

## 2015-01-19 DIAGNOSIS — K219 Gastro-esophageal reflux disease without esophagitis: Secondary | ICD-10-CM

## 2015-01-19 DIAGNOSIS — R590 Localized enlarged lymph nodes: Secondary | ICD-10-CM

## 2015-01-19 MED ORDER — SUMATRIPTAN SUCCINATE 100 MG PO TABS: 50.0000 mg | ORAL_TABLET | ORAL | Status: DC

## 2015-01-19 MED ORDER — SUMATRIPTAN SUCCINATE 100 MG PO TABS: 50.0000 mg | ORAL_TABLET | ORAL | Status: DC | PRN

## 2015-01-19 NOTE — Assessment & Plan Note (Addendum)
Marked improvement with sumatriptan. Continue this. Refilled today. Advised if more frequent headaches to notify us to discuss treatment with daily ppx medication.

## 2015-01-19 NOTE — Assessment & Plan Note (Signed)
Omeprazole 40mg  daily working well. Failed OTC strength

## 2015-01-19 NOTE — Patient Instructions (Addendum)
Continue sumatriptan for migraine treatment.  Migraine Headache A migraine headache is an intense, throbbing pain on one or both sides of your head. A migraine can last for 30 minutes to several hours. CAUSES  The exact cause of a migraine headache is not always known. However, a migraine may be caused when nerves in the brain become irritated and release chemicals that cause inflammation. This causes pain. Certain things may also trigger migraines, such as:  Alcohol.  Smoking.  Stress.  Menstruation.  Aged cheeses.  Foods or drinks that contain nitrates, glutamate, aspartame, or tyramine.  Lack of sleep.  Chocolate.  Caffeine.  Hunger.  Physical exertion.  Fatigue.  Medicines used to treat chest pain (nitroglycerine), birth control pills, estrogen, and some blood pressure medicines. SIGNS AND SYMPTOMS  Pain on one or both sides of your head.  Pulsating or throbbing pain.  Severe pain that prevents daily activities.  Pain that is aggravated by any physical activity.  Nausea, vomiting, or both.  Dizziness.  Pain with exposure to bright lights, loud noises, or activity.  General sensitivity to bright lights, loud noises, or smells. Before you get a migraine, you may get warning signs that a migraine is coming (aura). An aura may include:  Seeing flashing lights.  Seeing bright spots, halos, or zigzag lines.  Having tunnel vision or blurred vision.  Having feelings of numbness or tingling.  Having trouble talking.  Having muscle weakness. DIAGNOSIS  A migraine headache is often diagnosed based on:  Symptoms.  Physical exam.  A CT scan or MRI of your head. These imaging tests cannot diagnose migraines, but they can help rule out other causes of headaches. TREATMENT Medicines may be given for pain and nausea. Medicines can also be given to help prevent recurrent migraines.  HOME CARE INSTRUCTIONS  Only take over-the-counter or prescription  medicines for pain or discomfort as directed by your health care provider. The use of long-term narcotics is not recommended.  Lie down in a dark, quiet room when you have a migraine.  Keep a journal to find out what may trigger your migraine headaches. For example, write down:  What you eat and drink.  How much sleep you get.  Any change to your diet or medicines.  Limit alcohol consumption.  Quit smoking if you smoke.  Get 7-9 hours of sleep, or as recommended by your health care provider.  Limit stress.  Keep lights dim if bright lights bother you and make your migraines worse. SEEK IMMEDIATE MEDICAL CARE IF:   Your migraine becomes severe.  You have a fever.  You have a stiff neck.  You have vision loss.  You have muscular weakness or loss of muscle control.  You start losing your balance or have trouble walking.  You feel faint or pass out.  You have severe symptoms that are different from your first symptoms. MAKE SURE YOU:   Understand these instructions.  Will watch your condition.  Will get help right away if you are not doing well or get worse. Document Released: 07/07/2005 Document Revised: 11/21/2013 Document Reviewed: 03/14/2013 [facility] Patient Information 2015 [place], [place]. This information is not intended to replace advice given to you by your health care provider. Make sure you discuss any questions you have with your health care provider.

## 2015-01-19 NOTE — Progress Notes (Signed)
Pre visit review using our clinic review tool, if applicable. No additional management support is needed unless otherwise documented below in the visit note. 

## 2015-01-19 NOTE — Progress Notes (Signed)
   BP 110/60 mmHg  Pulse 66  Temp(Src) 98 F (36.7 C) (Oral)  Wt 156 lb (70.761 kg)  SpO2 98%   CC: HA 34mo f/u visit Subjective:    Patient ID: Darren Clark, male    DOB: 08-23-1965, 49 y.o.   MRN: [id number]  HPI: Darren Clark is a 49 y.o. male presenting on 01/19/2015 for Follow-up   See prior note for details.  Seen here 5/31 with concern for sinus infection vs migraine. Treated with augmentin, then sumatriptan course. Augmentin didn't make significant difference. However, has noticed significant difference with sumatriptan.   Getting migraine 2-3d/wk. This week no migraines. Migraines without aura.   Also found to have submandibular lymph node.   Relevant past medical, surgical, family and social history reviewed and updated as indicated. Interim medical history since our last visit reviewed. Allergies and medications reviewed and updated. Current Outpatient Prescriptions on File Prior to Visit  Medication Sig  . acetaminophen (TYLENOL) 500 MG tablet Take 500 mg by mouth as needed.  Marland Kitchen omeprazole (PRILOSEC) 40 MG capsule TAKE 1 CAPSULE (40 MG TOTAL) BY MOUTH 2 (TWO) TIMES DAILY.   No current facility-administered medications on file prior to visit.    Review of Systems Per HPI unless specifically indicated above     Objective:    BP 110/60 mmHg  Pulse 66  Temp(Src) 98 F (36.7 C) (Oral)  Wt 156 lb (70.761 kg)  SpO2 98%  Wt Readings from Last 3 Encounters:  01/19/15 156 lb (70.761 kg)  12/19/14 155 lb 12 oz (70.648 kg)  09/28/14 155 lb 12 oz (70.648 kg)    Physical Exam  Constitutional: He appears well-developed and well-nourished. No distress.  HENT:  Mouth/Throat: Oropharynx is clear and moist. No oropharyngeal exudate.  Neck:  No significant LAD noted today.  Cardiovascular: Normal rate, regular rhythm, normal heart sounds and intact distal pulses.   No murmur heard. Pulmonary/Chest: Effort normal and breath sounds normal. No respiratory distress. He has  no wheezes. He has no rales.  Musculoskeletal: He exhibits no edema.  Lymphadenopathy:    He has no cervical adenopathy.  Skin: Skin is warm and dry. No rash noted.  Psychiatric: He has a normal mood and affect.  Nursing note and vitals reviewed.     Assessment & Plan:   Problem List Items Addressed This Visit    GERD (gastroesophageal reflux disease)    Omeprazole 40mg  daily working well. Failed OTC strength      LAD (lymphadenopathy), submandibular    Resolving.      Migraine - Primary    Marked improvement with sumatriptan. Continue this. Refilled today. Advised if more frequent headaches to notify us to discuss treatment with daily ppx medication.       Relevant Medications   SUMAtriptan (IMITREX) 100 MG tablet       Follow up plan: Return if symptoms worsen or fail to improve.

## 2015-01-19 NOTE — Assessment & Plan Note (Signed)
Resolving

## 2015-03-24 ENCOUNTER — Other Ambulatory Visit: Payer: Self-pay | Admitting: Family Medicine

## 2015-07-04 ENCOUNTER — Ambulatory Visit
Admission: RE | Admit: 2015-07-04 | Discharge: 2015-07-04 | Disposition: A | Discharge status: Home / Self Care | Payer: BLUE CROSS/BLUE SHIELD | Source: Ambulatory Visit | Attending: Family Medicine | Admitting: Family Medicine

## 2015-07-04 ENCOUNTER — Encounter: Payer: Self-pay | Admitting: Family Medicine

## 2015-07-04 ENCOUNTER — Ambulatory Visit (INDEPENDENT_AMBULATORY_CARE_PROVIDER_SITE_OTHER): Payer: BLUE CROSS/BLUE SHIELD | Admitting: Family Medicine

## 2015-07-04 VITALS — BP 122/82 | HR 64 | Temp 97.2°F | Wt 159.2 lb

## 2015-07-04 DIAGNOSIS — K449 Diaphragmatic hernia without obstruction or gangrene: Secondary | ICD-10-CM | POA: Diagnosis not present

## 2015-07-04 DIAGNOSIS — R1011 Right upper quadrant pain: Secondary | ICD-10-CM

## 2015-07-04 DIAGNOSIS — R1012 Left upper quadrant pain: Secondary | ICD-10-CM | POA: Diagnosis not present

## 2015-07-04 LAB — CBC WITH DIFFERENTIAL/PLATELET
BASOS ABS: 0 10*3/uL (ref 0.0–0.1)
Basophils Relative: 0.6 % (ref 0.0–3.0)
Eosinophils Absolute: 0.3 10*3/uL (ref 0.0–0.7)
Eosinophils Relative: 3.4 % (ref 0.0–5.0)
HCT: 41.8 % (ref 39.0–52.0)
Hemoglobin: 13.8 g/dL (ref 13.0–17.0)
LYMPHS ABS: 2.1 10*3/uL (ref 0.7–4.0)
Lymphocytes Relative: 25.2 % (ref 12.0–46.0)
MCHC: 32.9 g/dL (ref 30.0–36.0)
MCV: 86.4 fl (ref 78.0–100.0)
MONO ABS: 0.6 10*3/uL (ref 0.1–1.0)
Monocytes Relative: 6.7 % (ref 3.0–12.0)
NEUTROS ABS: 5.3 10*3/uL (ref 1.4–7.7)
NEUTROS PCT: 64.1 % (ref 43.0–77.0)
PLATELETS: 269 10*3/uL (ref 150.0–400.0)
RBC: 4.84 Mil/uL (ref 4.22–5.81)
RDW: 15.1 % (ref 11.5–15.5)
WBC: 8.2 10*3/uL (ref 4.0–10.5)

## 2015-07-04 LAB — POCT URINALYSIS DIPSTICK
BILIRUBIN UA: NEGATIVE
GLUCOSE UA: NEGATIVE
Ketones, UA: NEGATIVE
Leukocytes, UA: NEGATIVE
NITRITE UA: NEGATIVE
Protein, UA: NEGATIVE
Spec Grav, UA: 1.025
UROBILINOGEN UA: 0.2
pH, UA: 6.5

## 2015-07-04 LAB — COMPREHENSIVE METABOLIC PANEL
ALK PHOS: 102 U/L (ref 39–117)
ALT: 22 U/L (ref 0–53)
AST: 17 U/L (ref 0–37)
Albumin: 4.4 g/dL (ref 3.5–5.2)
BILIRUBIN TOTAL: 0.5 mg/dL (ref 0.2–1.2)
BUN: 18 mg/dL (ref 6–23)
CO2: 28 meq/L (ref 19–32)
Calcium: 9.4 mg/dL (ref 8.4–10.5)
Chloride: 105 mEq/L (ref 96–112)
Creatinine, Ser: 0.86 mg/dL (ref 0.40–1.50)
GFR: 100.06 mL/min (ref >60.00)
GLUCOSE: 98 mg/dL (ref 70–99)
POTASSIUM: 4.5 meq/L (ref 3.5–5.1)
SODIUM: 139 meq/L (ref 135–145)
TOTAL PROTEIN: 7.4 g/dL (ref 6.0–8.3)

## 2015-07-04 LAB — LIPASE: Lipase: 8 U/L — ABNORMAL LOW (ref 11.0–59.0)

## 2015-07-04 MED ORDER — IOHEXOL 300 MG/ML  SOLN
100.0000 mL | Freq: Once | INTRAMUSCULAR | Status: AC | PRN
  Administered 2015-07-04: 100 mL via INTRAVENOUS

## 2015-07-04 NOTE — Progress Notes (Signed)
Pre visit review using our clinic review tool, if applicable. No additional management support is needed unless otherwise documented below in the visit note. 

## 2015-07-04 NOTE — Assessment & Plan Note (Signed)
New- very tender on exam and uncomfortable. Etiology unclear- VSS and no other red flag symptoms. UA- trace blood- pain a little high in abdomen for kidney stones but could be referred pain. Given level of discomfort, stat CT of abdomen/pevlis, Stat labs. The patient indicates understanding of these issues and agrees with the plan.  Orders Placed This Encounter  Procedures  . Urine culture  . CT Abdomen Pelvis W Contrast  . CBC with Differential/Platelet  . Comprehensive metabolic panel  . Lipase  . Urinalysis Dipstick

## 2015-07-04 NOTE — Patient Instructions (Signed)
Nice to meet you. Please stop by to see Rosaria Ferries on your way out after you go to the lab. I will call you today as soon as I have any results. Go straight to the ER if your symptoms worsen.

## 2015-07-04 NOTE — Progress Notes (Signed)
Subjective:   Patient ID: Darren Clark, male    DOB: [birth year], 49 y.o.   MRN: [id number]  Constandinos Deeds is a pleasant 49 y.o. year old male pt of Dr. Darnell Level, new to me, with h/o GERD, who presents to clinic today with Abdominal Pain  on 07/04/2015  HPI:  Of note, saw Dr. Darnell Level for RUQ abdominal pain on 09/28/14. Note reviewed.  At that time, suspected symptoms were related to GERD- increased omeprazole to 40 mg daily along with adding carafate daily for 3 weeks. Advised abdomina US and GI referral for possible endoscopy if symptoms persisted.   Lipase, CBC, CMET were all reassuring at that time.  Symptoms resolved and did not pursue further work up.  Wt Readings from Last 3 Encounters:  07/04/15 159 lb 4 oz (72.235 kg)  01/19/15 156 lb (70.761 kg)  12/19/14 155 lb 12 oz (70.648 kg)   This pain is different- acute onset yesterday of very sharp left flank pain that radiates to left upper quadrant.  Most tender with movement and palpation. No nausea or vomiting. No diaphoresis. No known injury but did lift heavy objects at work yesterday.  Does not drink ETOH but does drink "a lot" of soda.  Current Outpatient Prescriptions on File Prior to Visit  Medication Sig Dispense Refill  . acetaminophen (TYLENOL) 500 MG tablet Take 500 mg by mouth as needed.    Marland Kitchen omeprazole (PRILOSEC) 40 MG capsule TAKE 1 CAPSULE TWICE A DAY 60 capsule 3  . SUMAtriptan (IMITREX) 100 MG tablet Take 0.5-1 tablets (50-100 mg total) by mouth as directed. Take med. May repeat in 2 hours x1 if headache persists or recurs. 10 tablet 4   No current facility-administered medications on file prior to visit.    No Known Allergies  Past Medical History  Diagnosis Date  . Generalized headaches   . Heart murmur     history  . History of migraines   . Ex-smoker 2014    currently smoking e cigarettes  . GERD (gastroesophageal reflux disease)     Past Surgical History  Procedure Laterality Date  . Tonsillectomy      . Wisdom tooth extraction    . Nasal sinus surgery  2015    deviated septum and nasal polyps    Family History  Problem Relation Age of Onset  . Cancer Mother     unsure  . Stroke Father   . Coronary artery disease Neg Hx   . Diabetes Neg Hx     Social History   Social History  . Marital Status: Single    Spouse Name: N/A  . Number of Children: N/A  . Years of Education: N/A   Occupational History  . Not on file.   Social History Main Topics  . Smoking status: Former Smoker -- 2.00 packs/day    Types: Cigarettes    Start date: 11/17/2012  . Smokeless tobacco: Never Used     Comment: has changed to e-cigarettes  . Alcohol Use: No     Comment: "used to"  . Drug Use: No     Comment: "used to"  . Sexual Activity: Not on file   Other Topics Concern  . Not on file   Social History Narrative   Caffeine: cheer wine soda 2 24 oz bottles/day   Remote MJ, cocaine, EtOH use   Lives with friends, several cats, 2 dogs   Occupation: Armed forces technical officer   Activity: walks dog some   Diet: rare fruits/vegetables,  lots of eating out, rare water   The PMH, PSH, Social History, Family History, Medications, and allergies have been reviewed in [facility], and have been updated if relevant.   Review of Systems  Constitutional: Negative.   Eyes: Negative.   Respiratory: Negative.   Gastrointestinal: Positive for abdominal pain. Negative for nausea, vomiting, diarrhea, constipation, blood in stool, abdominal distention, anal bleeding and rectal pain.  Genitourinary: Positive for flank pain. Negative for dysuria, urgency, frequency, decreased urine volume, difficulty urinating and genital sores.  Musculoskeletal: Negative.   Psychiatric/Behavioral: Negative.   All other systems reviewed and are negative.      Objective:    BP 122/82 mmHg  Pulse 64  Temp(Src) 97.2 F (36.2 C) (Oral)  Wt 159 lb 4 oz (72.235 kg)  SpO2 97%   Physical Exam  Constitutional: He is oriented to  person, place, and time. He appears well-developed and well-nourished.  Appears uncomfortable- adjusting in chair and cant seem to get comfortabe  HENT:  Head: Normocephalic and atraumatic.  Eyes: Conjunctivae are normal.  Cardiovascular: Normal rate.   Abdominal: He exhibits no distension and no mass. There is tenderness. There is guarding. There is no rebound.  TPP over epigastrium, LUQ, no pain over flank  Musculoskeletal: Normal range of motion.  Neurological: He is alert and oriented to person, place, and time. No cranial nerve deficit.  Skin: Skin is warm and dry. He is not diaphoretic.  Psychiatric: He has a normal mood and affect. His behavior is normal. Judgment and thought content normal.  Nursing note and vitals reviewed.         Assessment & Plan:   RUQ abdominal pain - Plan: CBC with Differential/Platelet, Comprehensive metabolic panel, Lipase, H. pylori antibody, IgG No Follow-up on file.

## 2015-07-05 LAB — URINE CULTURE
COLONY COUNT: NO GROWTH
ORGANISM ID, BACTERIA: NO GROWTH

## 2015-07-21 ENCOUNTER — Other Ambulatory Visit: Payer: Self-pay | Admitting: Family Medicine

## 2016-02-13 ENCOUNTER — Encounter: Payer: Self-pay | Admitting: Family Medicine

## 2016-02-13 ENCOUNTER — Ambulatory Visit (INDEPENDENT_AMBULATORY_CARE_PROVIDER_SITE_OTHER): Payer: BLUE CROSS/BLUE SHIELD | Admitting: Family Medicine

## 2016-02-13 VITALS — BP 128/88 | HR 80 | Temp 97.4°F | Ht 64.0 in | Wt 163.0 lb

## 2016-02-13 DIAGNOSIS — Z1211 Encounter for screening for malignant neoplasm of colon: Secondary | ICD-10-CM

## 2016-02-13 DIAGNOSIS — Z0000 Encounter for general adult medical examination without abnormal findings: Secondary | ICD-10-CM | POA: Diagnosis not present

## 2016-02-13 DIAGNOSIS — E785 Hyperlipidemia, unspecified: Secondary | ICD-10-CM | POA: Diagnosis not present

## 2016-02-13 DIAGNOSIS — G43009 Migraine without aura, not intractable, without status migrainosus: Secondary | ICD-10-CM

## 2016-02-13 DIAGNOSIS — K118 Other diseases of salivary glands: Secondary | ICD-10-CM | POA: Diagnosis not present

## 2016-02-13 DIAGNOSIS — R21 Rash and other nonspecific skin eruption: Secondary | ICD-10-CM

## 2016-02-13 DIAGNOSIS — K219 Gastro-esophageal reflux disease without esophagitis: Secondary | ICD-10-CM | POA: Diagnosis not present

## 2016-02-13 LAB — LIPID PANEL
CHOL/HDL RATIO: 6
CHOLESTEROL: 179 mg/dL (ref 0–200)
HDL: 29.8 mg/dL — ABNORMAL LOW (ref >39.00)
NONHDL: 149.55
Triglycerides: 216 mg/dL — ABNORMAL HIGH (ref 0.0–149.0)
VLDL: 43.2 mg/dL — AB (ref 0.0–40.0)

## 2016-02-13 LAB — BASIC METABOLIC PANEL
BUN: 14 mg/dL (ref 6–23)
CHLORIDE: 105 meq/L (ref 96–112)
CO2: 28 mEq/L (ref 19–32)
Calcium: 9.6 mg/dL (ref 8.4–10.5)
Creatinine, Ser: 0.93 mg/dL (ref 0.40–1.50)
GFR: 91.19 mL/min (ref >60.00)
Glucose, Bld: 90 mg/dL (ref 70–99)
POTASSIUM: 4.5 meq/L (ref 3.5–5.1)
Sodium: 141 mEq/L (ref 135–145)

## 2016-02-13 LAB — VITAMIN B12: VITAMIN B 12: 365 pg/mL (ref 211–911)

## 2016-02-13 LAB — LDL CHOLESTEROL, DIRECT: Direct LDL: 111 mg/dL

## 2016-02-13 LAB — VITAMIN D 25 HYDROXY (VIT D DEFICIENCY, FRACTURES): VITD: 27.24 ng/mL — AB (ref 30.00–100.00)

## 2016-02-13 MED ORDER — SUMATRIPTAN SUCCINATE 100 MG PO TABS: 50.0000 mg | ORAL_TABLET | ORAL | 4 refills | Status: DC

## 2016-02-13 MED ORDER — CLOBETASOL PROPIONATE 0.05 % EX CREA: 1.0000 "application " | TOPICAL_CREAM | Freq: Two times a day (BID) | CUTANEOUS | 0 refills | Status: DC

## 2016-02-13 NOTE — Progress Notes (Signed)
BP 128/88   Pulse 80   Temp 97.4 F (36.3 C) (Oral)   Ht 5\' 4"  (1.626 m)   Wt 163 lb (73.9 kg)   BMI 27.98 kg/m    CC: CPE Subjective:    Patient ID: Darren Clark, male    DOB: [birth year], 50 y.o.   MRN: [id number]  HPI: Darren Clark is a 50 y.o. male presenting on 02/13/2016 for Annual Exam and Rash   Longstanding rash over years right lower leg. Progressively worsening R leg rash over last few months. Itchy rash. Treated with cortisone which helped some. Chlorine in pool helps calm itch for a few days. No pain. No other rashes on skin. No new lotions, detergents, soaps or shampoos.  GERD - controlled on omeprazole 40mg  bid for last 1+ year. Notes significant GERD even if he misses one dose.   Migraines - treats with advil 3x/wk. Tylenol also upsets stomach. Needs imitrex refilled.   Preventative: Colon cancer screening - discussed, would like iFOB Prostate cancer screening - discussed, would like screen today.  Tdap 2012 Seat belt use discussed Sunscreen use discussed. No changing moles on skin.   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 Interim medical history since our last visit reviewed. Allergies and medications reviewed and updated. Current Outpatient Prescriptions on File Prior to Visit  Medication Sig  . acetaminophen (TYLENOL) 500 MG tablet Take 500 mg by mouth as needed.  Marland Kitchen omeprazole (PRILOSEC) 40 MG capsule TAKE 1 CAPSULE TWICE A DAY   No current facility-administered medications on file prior to visit.     Review of Systems  Constitutional: Negative for activity change, appetite change, chills, fatigue, fever and unexpected weight change.  HENT: Negative for hearing  loss.   Eyes: Negative for visual disturbance.  Respiratory: Negative for cough, chest tightness, shortness of breath and wheezing.   Cardiovascular: Negative for chest pain, palpitations and leg swelling.  Gastrointestinal: Negative for abdominal distention, abdominal pain, blood in stool, constipation, diarrhea, nausea and vomiting.  Genitourinary: Negative for difficulty urinating and hematuria.  Musculoskeletal: Negative for arthralgias, myalgias and neck pain.  Skin: Negative for rash.  Neurological: Negative for dizziness, seizures, syncope and headaches.  Hematological: Negative for adenopathy. Does not bruise/bleed easily.  Psychiatric/Behavioral: Negative for dysphoric mood. The patient is not nervous/anxious.    Per HPI unless specifically indicated in ROS section     Objective:    BP 128/88   Pulse 80   Temp 97.4 F (36.3 C) (Oral)   Ht 5\' 4"  (1.626 m)   Wt 163 lb (73.9 kg)   BMI 27.98 kg/m   Wt Readings from Last 3 Encounters:  02/13/16 163 lb (73.9 kg)  07/04/15 159 lb 4 oz (72.2 kg)  01/19/15 156 lb (70.8 kg)    Physical Exam  Constitutional: He is oriented to person, place, and time. He appears well-developed and well-nourished. No distress.  HENT:  Head: Normocephalic and atraumatic.  Right Ear: Hearing, tympanic membrane, external ear and ear canal normal.  Left Ear: Hearing, tympanic membrane, external ear and ear canal normal.  Nose: Nose normal.  Mouth/Throat: Uvula is midline, oropharynx is clear and moist and mucous membranes are normal. No oropharyngeal exudate, posterior oropharyngeal edema or posterior oropharyngeal  erythema.  Inflammation of left stetson's duct  Eyes: Conjunctivae and EOM are normal. Pupils are equal, round, and reactive to light. No scleral icterus.  Neck: Normal range of motion. Neck supple. No thyromegaly present.  Cardiovascular: Normal rate, regular rhythm, normal heart sounds and intact distal pulses.   No murmur  heard. Pulses:      Radial pulses are 2+ on the right side, and 2+ on the left side.  Pulmonary/Chest: Effort normal and breath sounds normal. No respiratory distress. He has no wheezes. He has no rales.  Abdominal: Soft. Bowel sounds are normal. He exhibits no distension and no mass. There is no tenderness. There is no rebound and no guarding.  Genitourinary: Rectum normal and prostate normal. Rectal exam shows no external hemorrhoid, no internal hemorrhoid, no fissure, no mass, no tenderness and anal tone normal. Prostate is not enlarged (20gm) and not tender.  Musculoskeletal: Normal range of motion. He exhibits no edema.  Lymphadenopathy:    He has no cervical adenopathy.  Neurological: He is alert and oriented to person, place, and time.  CN grossly intact, station and gait intact  Skin: Skin is warm and dry. Rash noted.  R lateral lower leg patch of pruritic dry skin with scaling and lichenification  Psychiatric: He has a normal mood and affect. His behavior is normal. Judgment and thought content normal.  Nursing note and vitals reviewed.     Assessment & Plan:   Problem List Items Addressed This Visit    GERD (gastroesophageal reflux disease)    Longstanding omeprazole 40mg  BID. Discussed GI referral for EGD given severity of GERD. Pt will consider and let me know if agrees.       Relevant Orders   Basic metabolic panel   VITAMIN D 25 Hydroxy (Vit-D Deficiency, Fractures)   Vitamin B12   Health maintenance examination - Primary    Preventative protocols reviewed and updated unless pt declined. Discussed healthy diet and lifestyle.       HLD (hyperlipidemia)    Check FLP today.       Relevant Orders   Lipid panel   Basic metabolic panel   Migraine    Continue prn imitrex.  Discussed abortive therapies.      Relevant Medications   SUMAtriptan (IMITREX) 100 MG tablet   Salivary duct obstruction    New over 2 days. rec start sour candy. If not improved over next  2-3 wks, update [place] for referral to ENT or see dentist. No obvious stone appreciated today.      Skin rash    Anticipate xerosis - treat with clobetasol cream x 2 wks then regular moisturizing cream. Update if not improved with treatment.        Other Visit Diagnoses    Special screening for malignant neoplasms, colon       Relevant Orders   Fecal occult blood, imunochemical       Follow up plan: Return in about 1 year (around 02/12/2017), or as needed, for annual exam, prior fasting for blood work.  Ria Bush, MD

## 2016-02-13 NOTE — Assessment & Plan Note (Signed)
New over 2 days. rec start sour candy. If not improved over next 2-3 wks, update [place] for referral to ENT or see dentist. No obvious stone appreciated today.

## 2016-02-13 NOTE — Assessment & Plan Note (Signed)
Longstanding omeprazole 40mg  BID. Discussed GI referral for EGD given severity of GERD. Pt will consider and let me know if agrees.

## 2016-02-13 NOTE — Progress Notes (Signed)
Pre visit review using our clinic review tool, if applicable. No additional management support is needed unless otherwise documented below in the visit note. 

## 2016-02-13 NOTE — Assessment & Plan Note (Signed)
Check FLP today. 

## 2016-02-13 NOTE — Assessment & Plan Note (Signed)
Continue prn imitrex.  Discussed abortive therapies.

## 2016-02-13 NOTE — Assessment & Plan Note (Signed)
Preventative protocols reviewed and updated unless pt declined. Discussed healthy diet and lifestyle.  

## 2016-02-13 NOTE — Patient Instructions (Addendum)
You are doing well today. Pass by lab for stool kit.  Labs today.  Right leg rash likely from dry skin - treat with stronger steorid cream sent to pharmacy twice daily for 2 weeks then stop. May continue regular moisturizing cream (aveeno or eucerin).  You have inflammation of salivary duct in mouth. Treat with sour candy. If not improving over next 2-3 weeks, see dentist or let us know for referral to see ENT.  Let me know if you'd like to see GI to discuss heartburn.  Return as needed or in 1 year for next physical.  Health Maintenance, Male A healthy lifestyle and preventative care can promote health and wellness.  Maintain regular health, dental, and eye exams.  Eat a healthy diet. Foods like vegetables, fruits, whole grains, low-fat dairy products, and lean protein foods contain the nutrients you need and are low in calories. Decrease your intake of foods high in solid fats, added sugars, and salt. Get information about a proper diet from your health care provider, if necessary.  Regular physical exercise is one of the most important things you can do for your health. Most adults should get at least 150 minutes of moderate-intensity exercise (any activity that increases your heart rate and causes you to sweat) each week. In addition, most adults need muscle-strengthening exercises on 2 or more days a week.   Maintain a healthy weight. The body mass index (BMI) is a screening tool to identify possible weight problems. It provides an estimate of body fat based on height and weight. Your health care provider can find your BMI and can help you achieve or maintain a healthy weight. For males 20 years and older:  A BMI below 18.5 is considered underweight.  A BMI of 18.5 to 24.9 is normal.  A BMI of 25 to 29.9 is considered overweight.  A BMI of 30 and above is considered obese.  Maintain normal blood lipids and cholesterol by exercising and minimizing your intake of saturated fat. Eat a  balanced diet with plenty of fruits and vegetables. Blood tests for lipids and cholesterol should begin at age 110 and be repeated every 5 years. If your lipid or cholesterol levels are high, you are over age 20, or you are at high risk for heart disease, you may need your cholesterol levels checked more frequently.Ongoing high lipid and cholesterol levels should be treated with medicines if diet and exercise are not working.  If you smoke, find out from your health care provider how to quit. If you do not use tobacco, do not start.  Lung cancer screening is recommended for adults aged 15-80 years who are at high risk for developing lung cancer because of a history of smoking. A yearly low-dose CT scan of the lungs is recommended for people who have at least a 30-pack-year history of smoking and are current smokers or have quit within the past 15 years. A pack year of smoking is smoking an average of 1 pack of cigarettes a day for 1 year (for example, a 30-pack-year history of smoking could mean smoking 1 pack a day for 30 years or 2 packs a day for 15 years). Yearly screening should continue until the smoker has stopped smoking for at least 15 years. Yearly screening should be stopped for people who develop a health problem that would prevent them from having lung cancer treatment.  If you choose to drink alcohol, do not have more than 2 drinks per day. One drink is  considered to be 12 oz (360 mL) of beer, 5 oz (150 mL) of wine, or 1.5 oz (45 mL) of liquor.  Avoid the use of street drugs. Do not share needles with anyone. Ask for help if you need support or instructions about stopping the use of drugs.  High blood pressure causes heart disease and increases the risk of stroke. High blood pressure is more likely to develop in:  People who have blood pressure in the end of the normal range (100-139/85-89 mm Hg).  People who are overweight or obese.  People who are African American.  If you are 27-35  years of age, have your blood pressure checked every 3-5 years. If you are 90 years of age or older, have your blood pressure checked every year. You should have your blood pressure measured twice--once when you are at a hospital or clinic, and once when you are not at a hospital or clinic. Record the average of the two measurements. To check your blood pressure when you are not at a hospital or clinic, you can use:  An automated blood pressure machine at a pharmacy.  A home blood pressure monitor.  If you are 74-43 years old, ask your health care provider if you should take aspirin to prevent heart disease.  Diabetes screening involves taking a blood sample to check your fasting blood sugar level. This should be done once every 3 years after age 42 if you are at a normal weight and without risk factors for diabetes. Testing should be considered at a younger age or be carried out more frequently if you are overweight and have at least 1 risk factor for diabetes.  Colorectal cancer can be detected and often prevented. Most routine colorectal cancer screening begins at the age of 9 and continues through age 65. However, your health care provider may recommend screening at an earlier age if you have risk factors for colon cancer. On a yearly basis, your health care provider may provide home test kits to check for hidden blood in the stool. A small camera at the end of a tube may be used to directly examine the colon (sigmoidoscopy or colonoscopy) to detect the earliest forms of colorectal cancer. Talk to your health care provider about this at age 14 when routine screening begins. A direct exam of the colon should be repeated every 5-10 years through age 55, unless early forms of precancerous polyps or small growths are found.  People who are at an increased risk for hepatitis B should be screened for this virus. You are considered at high risk for hepatitis B if:  You were born in a country where  hepatitis B occurs often. Talk with your health care provider about which countries are considered high risk.  Your parents were born in a high-risk country and you have not received a shot to protect against hepatitis B (hepatitis B vaccine).  You have HIV or AIDS.  You use needles to inject street drugs.  You live with, or have sex with, someone who has hepatitis B.  You are a man who has sex with other men (MSM).  You get hemodialysis treatment.  You take certain medicines for conditions like cancer, organ transplantation, and autoimmune conditions.  Hepatitis C blood testing is recommended for all people born from [birth year] through 1966 and any individual with known risk factors for hepatitis C.  Healthy men should no longer receive prostate-specific antigen (PSA) blood tests as part of routine cancer screening.  Talk to your health care provider about prostate cancer screening.  Testicular cancer screening is not recommended for adolescents or adult males who have no symptoms. Screening includes self-exam, a health care provider exam, and other screening tests. Consult with your health care provider about any symptoms you have or any concerns you have about testicular cancer.  Practice safe sex. Use condoms and avoid high-risk sexual practices to reduce the spread of sexually transmitted infections (STIs).  You should be screened for STIs, including gonorrhea and chlamydia if:  You are sexually active and are younger than 24 years.  You are older than 24 years, and your health care provider tells you that you are at risk for this type of infection.  Your sexual activity has changed since you were last screened, and you are at an increased risk for chlamydia or gonorrhea. Ask your health care provider if you are at risk.  If you are at risk of being infected with HIV, it is recommended that you take a prescription medicine daily to prevent HIV infection. This is called pre-exposure  prophylaxis (PrEP). You are considered at risk if:  You are a man who has sex with other men (MSM).  You are a heterosexual man who is sexually active with multiple partners.  You take drugs by injection.  You are sexually active with a partner who has HIV.  Talk with your health care provider about whether you are at high risk of being infected with HIV. If you choose to begin PrEP, you should first be tested for HIV. You should then be tested every 3 months for as long as you are taking PrEP.  Use sunscreen. Apply sunscreen liberally and repeatedly throughout the day. You should seek shade when your shadow is shorter than you. Protect yourself by wearing long sleeves, pants, a wide-brimmed hat, and sunglasses year round whenever you are outdoors.  Tell your health care provider of new moles or changes in moles, especially if there is a change in shape or color. Also, tell your health care provider if a mole is larger than the size of a pencil eraser.  A one-time screening for abdominal aortic aneurysm (AAA) and surgical repair of large AAAs by ultrasound is recommended for men aged 72-75 years who are current or former smokers.  Stay current with your vaccines (immunizations).   This information is not intended to replace advice given to you by your health care provider. Make sure you discuss any questions you have with your health care provider.   Document Released: 01/03/2008 Document Revised: 07/28/2014 Document Reviewed: 12/02/2010 Elsevier Interactive Patient Education [employer].

## 2016-02-13 NOTE — Assessment & Plan Note (Signed)
Anticipate xerosis - treat with clobetasol cream x 2 wks then regular moisturizing cream. Update if not improved with treatment.

## 2016-02-18 ENCOUNTER — Encounter: Payer: Self-pay | Admitting: Family Medicine

## 2016-02-18 ENCOUNTER — Encounter: Payer: Self-pay | Admitting: *Deleted

## 2016-02-18 DIAGNOSIS — E559 Vitamin D deficiency, unspecified: Secondary | ICD-10-CM | POA: Insufficient documentation

## 2016-02-19 ENCOUNTER — Telehealth: Payer: Self-pay | Admitting: *Deleted

## 2016-02-19 NOTE — Telephone Encounter (Signed)
Clobetasol cream required PA. Completed and denied. Pharmacy notified.

## 2016-02-25 ENCOUNTER — Other Ambulatory Visit: Payer: Self-pay | Admitting: Family Medicine

## 2016-05-12 ENCOUNTER — Telehealth: Payer: Self-pay | Admitting: Family Medicine

## 2016-05-12 DIAGNOSIS — K219 Gastro-esophageal reflux disease without esophagitis: Secondary | ICD-10-CM

## 2016-05-12 NOTE — Telephone Encounter (Signed)
Patient called and said Dr.Gutierrez recommended patient have an endoscopy done at his last office visit.  Patient said he would like to have an endoscopy done.  Patient would like to have it done at [facility].  Patient prefers a morning appointment.  He can go anytime,but this week.

## 2016-05-13 NOTE — Telephone Encounter (Signed)
Referral placed.

## 2016-05-15 ENCOUNTER — Encounter: Payer: Self-pay | Admitting: Gastroenterology

## 2016-05-23 ENCOUNTER — Ambulatory Visit (INDEPENDENT_AMBULATORY_CARE_PROVIDER_SITE_OTHER): Payer: BLUE CROSS/BLUE SHIELD | Admitting: Gastroenterology

## 2016-05-23 ENCOUNTER — Encounter: Payer: Self-pay | Admitting: Gastroenterology

## 2016-05-23 ENCOUNTER — Encounter (INDEPENDENT_AMBULATORY_CARE_PROVIDER_SITE_OTHER): Payer: Self-pay

## 2016-05-23 VITALS — BP 132/84 | HR 78 | Ht 64.0 in | Wt 166.0 lb

## 2016-05-23 DIAGNOSIS — K219 Gastro-esophageal reflux disease without esophagitis: Secondary | ICD-10-CM | POA: Diagnosis not present

## 2016-05-23 DIAGNOSIS — Z1211 Encounter for screening for malignant neoplasm of colon: Secondary | ICD-10-CM

## 2016-05-23 DIAGNOSIS — R1013 Epigastric pain: Secondary | ICD-10-CM

## 2016-05-23 MED ORDER — NA SULFATE-K SULFATE-MG SULF 17.5-3.13-1.6 GM/177ML PO SOLN: 1.0000 | Freq: Once | ORAL | 0 refills | Status: AC

## 2016-05-23 NOTE — Progress Notes (Signed)
HPI :  50 y/o male with a history of migraine HA, tobacco use, and reflux, here for evaluation for reflux.   He reports a history of migraines, taking advil daily, at least 2 per day recently. He thinks this may be helping his headaches but he developed epigastric discomfort for the past few weeks. He reports it is a mild discomfort, dull ache, it comes and goes. Eating does not make it worse. He reports longstanding heartburn. He is taking omeprazole 40mg  BID - he reports this has controlled his heartburn over time for the most part. Some foods can cause breakthrough heartburn despite this. He denies any dysphagia but food can "sit" at the top of his stomach after eating. No esophageal cancer or colon cancer in the family.   He denies any prior colon cancer screening. No blood in the stools. He denies any bowel habit changes.   CT scan of the abdomen done in 06/2015 for abdominal pain was negative.  Past Medical History:  Diagnosis Date  . Ex-smoker 2014   currently smoking e cigarettes  . Generalized headaches   . GERD (gastroesophageal reflux disease)   . Heart murmur    history  . History of migraines      Past Surgical History:  Procedure Laterality Date  . NASAL SINUS SURGERY  2015   deviated septum and nasal polyps  . TONSILLECTOMY    . WISDOM TOOTH EXTRACTION     Family History  Problem Relation Age of Onset  . Cancer Mother     unsure  . Stroke Father   . Coronary artery disease Neg Hx   . Diabetes Neg Hx    Social History  Substance Use Topics  . Smoking status: Former Smoker    Packs/day: 2.00    Types: Cigarettes    Start date: 11/17/2012  . Smokeless tobacco: Never Used     Comment: has changed to e-cigarettes  . Alcohol use No     Comment: "used to"   Current Outpatient Prescriptions  Medication Sig Dispense Refill  . acetaminophen (TYLENOL) 500 MG tablet Take 500 mg by mouth as needed.    . clobetasol cream (TEMOVATE) [id number] % Apply 1 application  topically 2 (two) times daily. Apply to AA 30 g 0  . omeprazole (PRILOSEC) 40 MG capsule TAKE 1 CAPSULE TWICE A DAY 60 capsule 6  . SUMAtriptan (IMITREX) 100 MG tablet Take 0.5-1 tablets (50-100 mg total) by mouth as directed. Take med. May repeat in 2 hours x1 if headache persists or recurs. 10 tablet 4   No current facility-administered medications for this visit.    No Known Allergies   Review of Systems: All systems reviewed and negative except where noted in HPI.   Lab Results  Component Value Date   WBC 8.2 07/04/2015   HGB 13.8 07/04/2015   HCT 41.8 07/04/2015   MCV 86.4 07/04/2015   PLT 269.0 07/04/2015    Lab Results  Component Value Date   ALT 22 07/04/2015   AST 17 07/04/2015   ALKPHOS 102 07/04/2015   BILITOT 0.5 07/04/2015    Lab Results  Component Value Date   CREATININE 0.93 02/13/2016   BUN 14 02/13/2016   NA 141 02/13/2016   K 4.5 02/13/2016   CL 105 02/13/2016   CO2 28 02/13/2016     Physical Exam: There were no vitals taken for this visit. Constitutional: Pleasant,well-developed, male in no acute distress. HEENT: Normocephalic and atraumatic. Conjunctivae are normal. No  scleral icterus. Neck supple.  Cardiovascular: Normal rate, regular rhythm.  Pulmonary/chest: Effort normal and breath sounds normal. No wheezing, rales or rhonchi. Abdominal: Soft, nondistended, nontender. There are no masses palpable. No hepatomegaly. Extremities: no edema Lymphadenopathy: No cervical adenopathy noted. Neurological: Alert and oriented to person place and time. Skin: Skin is warm and dry. No rashes noted. Psychiatric: Normal mood and affect. Behavior is normal.   ASSESSMENT AND PLAN: 50 year old Caucasian male with a history of reflux, tobacco use who has recently developed migraine headaches and using significant amount of NSAIDs, presenting with epigastric discomfort. It is quite possible he has NSAID-related gastritis causing his symptoms and asked him to  avoid all NSAIDs and see if this makes him feel better. He otherwise has long-standing reflux, and given his age, ethnicity, and tobacco use, he meets criteria for screening EGD to rule out Barrett's esophagus. I discussed risks and benefits of endoscopy with him and he wished to proceed, this will also evaluate his epigastric pain.   We otherwise discussed options for colon cancer screening as he never had prior screening. Following discussion of optical colonoscopy including risk and benefits, he wished to proceed. It'll be done same time of the EGD. All questions answered.  Oradell Cellar, MD [facility] Pager [phone]  CC: Ria Bush, MD

## 2016-05-23 NOTE — Patient Instructions (Signed)
If you are age 51 or older, your body mass index should be between 23-30. Your Body mass index is 28.49 kg/m. If this is out of the aforementioned range listed, please consider follow up with your Primary Care Provider.  If you are age 71 or younger, your body mass index should be between 19-25. Your Body mass index is 28.49 kg/m. If this is out of the aformentioned range listed, please consider follow up with your Primary Care Provider.   We have sent the following medications to your pharmacy for you to pick up at your convenience: [place] have been scheduled for an endoscopy and colonoscopy. Please follow the written instructions given to you at your visit today. Please pick up your prep supplies at the pharmacy within the next 1-3 days. If you use inhalers (even only as needed), please bring them with you on the day of your procedure. Your physician has requested that you go to www.startemmi.com and enter the access code given to you at your visit today. This web site gives a general overview about your procedure. However, you should still follow specific instructions given to you by our office regarding your preparation for the procedure.

## 2016-06-06 ENCOUNTER — Encounter: Payer: Self-pay | Admitting: Gastroenterology

## 2016-06-20 ENCOUNTER — Telehealth: Payer: Self-pay | Admitting: Gastroenterology

## 2016-06-20 ENCOUNTER — Ambulatory Visit (AMBULATORY_SURGERY_CENTER): Payer: BLUE CROSS/BLUE SHIELD | Admitting: Gastroenterology

## 2016-06-20 ENCOUNTER — Encounter: Payer: Self-pay | Admitting: Gastroenterology

## 2016-06-20 VITALS — BP 134/96 | HR 60 | Temp 99.1°F | Resp 17 | Ht 64.0 in | Wt 166.0 lb

## 2016-06-20 DIAGNOSIS — D127 Benign neoplasm of rectosigmoid junction: Secondary | ICD-10-CM

## 2016-06-20 DIAGNOSIS — Z1212 Encounter for screening for malignant neoplasm of rectum: Secondary | ICD-10-CM | POA: Diagnosis not present

## 2016-06-20 DIAGNOSIS — R1013 Epigastric pain: Secondary | ICD-10-CM | POA: Diagnosis not present

## 2016-06-20 DIAGNOSIS — Z1211 Encounter for screening for malignant neoplasm of colon: Secondary | ICD-10-CM

## 2016-06-20 DIAGNOSIS — K635 Polyp of colon: Secondary | ICD-10-CM | POA: Diagnosis not present

## 2016-06-20 DIAGNOSIS — K2950 Unspecified chronic gastritis without bleeding: Secondary | ICD-10-CM | POA: Diagnosis not present

## 2016-06-20 HISTORY — PX: ESOPHAGOGASTRODUODENOSCOPY: SHX1529

## 2016-06-20 HISTORY — PX: COLONOSCOPY: SHX174

## 2016-06-20 MED ORDER — SODIUM CHLORIDE 0.9 % IV SOLN: 500.0000 mL | INTRAVENOUS | Status: DC

## 2016-06-20 NOTE — Progress Notes (Signed)
Called to room to assist during endoscopic procedure.  Patient ID and intended procedure confirmed with present staff. Received instructions for my participation in the procedure from the performing physician.  

## 2016-06-20 NOTE — Op Note (Signed)
[facility] Patient Name: Darren Clark Procedure Date: 06/20/2016 3:16 PM MRN: [id number] Endoscopist: Remo Lipps P. Krikor Willet MD, MD Age: 50 Referring MD:  Date of Birth: 03/20/1966 Gender: Male Account #: [id number] Procedure:                Colonoscopy Indications:              Screening for colorectal malignant neoplasm, This                            is the patient's first colonoscopy Medicines:                Monitored Anesthesia Care Procedure:                Pre-Anesthesia Assessment:                           - Prior to the procedure, a History and Physical                            was performed, and patient medications and                            allergies were reviewed. The patient's tolerance of                            previous anesthesia was also reviewed. The risks                            and benefits of the procedure and the sedation                            options and risks were discussed with the patient.                            All questions were answered, and informed consent                            was obtained. Prior Anticoagulants: The patient has                            taken no previous anticoagulant or antiplatelet                            agents. ASA Grade Assessment: II - A patient with                            mild systemic disease. After reviewing the risks                            and benefits, the patient was deemed in                            satisfactory condition to undergo the procedure.  After obtaining informed consent, the colonoscope                            was passed under direct vision. Throughout the                            procedure, the patient's blood pressure, pulse, and                            oxygen saturations were monitored continuously. The                            Model CF-HQ190L [id number]) scope was introduced                            through the anus  and advanced to the the terminal                            ileum, with identification of the appendiceal                            orifice and IC valve. The colonoscopy was performed                            without difficulty. The patient tolerated the                            procedure well. The quality of the bowel                            preparation was good. The terminal ileum, ileocecal                            valve, appendiceal orifice, and rectum were                            photographed. Scope In: 3:38:16 PM Scope Out: 3:51:21 PM Scope Withdrawal Time: 0 hours 11 minutes 20 seconds  Total Procedure Duration: 0 hours 13 minutes 5 seconds  Findings:                 The perianal and digital rectal examinations were                            normal.                           A 3 mm polyp was found in the recto-sigmoid colon.                            The polyp was sessile. The polyp was removed with a                            cold biopsy forceps. Resection and retrieval were  complete.                           The terminal ileum appeared normal.                           A few small-mouthed diverticula were found in the                            sigmoid colon.                           Internal hemorrhoids were found during                            retroflexion. The hemorrhoids were small.                           The exam was otherwise without abnormality. Complications:            No immediate complications. Estimated blood loss:                            Minimal. Estimated Blood Loss:     Estimated blood loss was minimal. Impression:               - One 3 mm polyp at the recto-sigmoid colon,                            removed with a cold biopsy forceps. Resected and                            retrieved.                           - The examined portion of the ileum was normal.                           - Diverticulosis in the  sigmoid colon.                           - Internal hemorrhoids.                           - The examination was otherwise normal. Recommendation:           - Patient has a contact number available for                            emergencies. The signs and symptoms of potential                            delayed complications were discussed with the                            patient. Return to normal activities tomorrow.  Written discharge instructions were provided to the                            patient.                           - Resume previous diet.                           - Continue present medications.                           - Await pathology results.                           - Repeat colonoscopy is recommended for                            surveillance. The colonoscopy date will be                            determined after pathology results from today's                            exam become available for review. Remo Lipps P. Alejandria Wessells MD, MD 06/20/2016 3:55:06 PM This report has been signed electronically.

## 2016-06-20 NOTE — Op Note (Signed)
[place] Patient Name: Darren Clark Procedure Date: 06/20/2016 3:16 PM MRN: [id number] Endoscopist: Remo Lipps P. Katelen Luepke MD, MD Age: 50 Referring MD:  Date of Birth: [birth year] Gender: Male Account #: [id number] Procedure:                Upper GI endoscopy Indications:              Epigastric abdominal pain, history of heartburn Medicines:                Monitored Anesthesia Care Procedure:                Pre-Anesthesia Assessment:                           - Prior to the procedure, a History and Physical                            was performed, and patient medications and                            allergies were reviewed. The patient's tolerance of                            previous anesthesia was also reviewed. The risks                            and benefits of the procedure and the sedation                            options and risks were discussed with the patient.                            All questions were answered, and informed consent                            was obtained. Prior Anticoagulants: The patient has                            taken no previous anticoagulant or antiplatelet                            agents. ASA Grade Assessment: II - A patient with                            mild systemic disease. After reviewing the risks                            and benefits, the patient was deemed in                            satisfactory condition to undergo the procedure.                           After obtaining informed consent, the endoscope was  passed under direct vision. Throughout the                            procedure, the patient's blood pressure, pulse, and                            oxygen saturations were monitored continuously. The                            Model GIF-HQ190 [id number]) scope was introduced                            through the mouth, and advanced to the second part   of duodenum. The upper GI endoscopy was                            accomplished without difficulty. The patient                            tolerated the procedure well. Scope In: Scope Out: Findings:                 Esophagogastric landmarks were identified: the                            Z-line was found at 34 cm, the gastroesophageal                            junction was found at 34 cm and the upper extent of                            the gastric folds was found at 38 cm from the                            incisors.                           A 4 cm hiatal hernia was present.                           A single area of suspected ectopic gastric mucosa                            was found in the upper third of the esophagus,                            although it was slightly more prominent / nodular                            than typical appearance. Biopsies were taken with a                            cold forceps for histology.  The exam of the esophagus was otherwise normal. No                            evidence of Barrett's esophagus                           The entire examined stomach was normal. Biopsies                            were taken with a cold forceps for Helicobacter                            pylori testing.                           The duodenal bulb and second portion of the                            duodenum were normal. Biopsies for histology were                            taken with a cold forceps for evaluation of celiac                            disease. Complications:            No immediate complications. Estimated blood loss:                            Minimal. Estimated Blood Loss:     Estimated blood loss was minimal. Impression:               - Esophagogastric landmarks identified.                           - 4 cm hiatal hernia.                           - Ectopic gastric mucosa in the upper third of the                             esophagus. Biopsied.                           - Normal stomach. Biopsied.                           - Normal duodenal bulb and second portion of the                            duodenum. Biopsied. Recommendation:           - Patient has a contact number available for                            emergencies. The signs and symptoms of potential  delayed complications were discussed with the                            patient. Return to normal activities tomorrow.                            Written discharge instructions were provided to the                            patient.                           - Resume previous diet.                           - Continue present medications.                           - Await pathology results. Remo Lipps P. Naw Lasala MD, MD 06/20/2016 3:59:32 PM This report has been signed electronically.

## 2016-06-20 NOTE — Telephone Encounter (Signed)
Returned patient's call and he states that he did eat yesterday, 3 meals.  He did do his prep at 6 pm yesterday and 10:00 am this morning.  His stools are liquid but has some color to it.  I addressed this to Dr. Havery Moros and he advised that we should do an enema on him when he arrives this afternoon to see how his stools are.  Dr. Havery Moros stated we would do the best we can in seeing into his colon and would do the procedures as scheduled.  I expressed all this to patient and he understood. Also, expressed to patient not to have anything by mouth 3 hours prior to procedure and he agreed he would not.

## 2016-06-20 NOTE — Patient Instructions (Signed)
Impression/Recommendations:  Hiatal hernia handout given to patient. Polyp handout given to patient. Hemorrhoid handout given to patient. Diverticulosis handout given to patient.  Repeat colonoscopy recommended for surveillance.  Date to be determined after pathology results reviewed.  YOU HAD AN ENDOSCOPIC PROCEDURE TODAY AT [facility]:   Refer to the procedure report that was given to you for any specific questions about what was found during the examination.  If the procedure report does not answer your questions, please call your gastroenterologist to clarify.  If you requested that your care partner not be given the details of your procedure findings, then the procedure report has been included in a sealed envelope for you to review at your convenience later.  YOU SHOULD EXPECT: Some feelings of bloating in the abdomen. Passage of more gas than usual.  Walking can help get rid of the air that was put into your GI tract during the procedure and reduce the bloating. If you had a lower endoscopy (such as a colonoscopy or flexible sigmoidoscopy) you may notice spotting of blood in your stool or on the toilet paper. If you underwent a bowel prep for your procedure, you may not have a normal bowel movement for a few days.  Please Note:  You might notice some irritation and congestion in your nose or some drainage.  This is from the oxygen used during your procedure.  There is no need for concern and it should clear up in a day or so.  SYMPTOMS TO REPORT IMMEDIATELY:   Following lower endoscopy (colonoscopy or flexible sigmoidoscopy):  Excessive amounts of blood in the stool  Significant tenderness or worsening of abdominal pains  Swelling of the abdomen that is new, acute  Fever of 100F or higher   Following upper endoscopy (EGD)  Vomiting of blood or coffee ground material  New chest pain or pain under the shoulder blades  Painful or persistently difficult  swallowing  New shortness of breath  Fever of 100F or higher  Black, tarry-looking stools  For urgent or emergent issues, a gastroenterologist can be reached at any hour by calling [phone].   DIET:  We do recommend a small meal at first, but then you may proceed to your regular diet.  Drink plenty of fluids but you should avoid alcoholic beverages for 24 hours.  ACTIVITY:  You should plan to take it easy for the rest of today and you should NOT DRIVE or use heavy machinery until tomorrow (because of the sedation medicines used during the test).    FOLLOW UP: Our staff will call the number listed on your records the next business day following your procedure to check on you and address any questions or concerns that you may have regarding the information given to you following your procedure. If we do not reach you, we will leave a message.  However, if you are feeling well and you are not experiencing any problems, there is no need to return our call.  We will assume that you have returned to your regular daily activities without incident.  If any biopsies were taken you will be contacted by phone or by letter within the next 1-3 weeks.  Please call us at [phone] if you have not heard about the biopsies in 3 weeks.    SIGNATURES/CONFIDENTIALITY: You and/or your care partner have signed paperwork which will be entered into your electronic medical record.  These signatures attest to the fact that that the information above  on your After Visit Summary has been reviewed and is understood.  Full responsibility of the confidentiality of this discharge information lies with you and/or your care-partner.

## 2016-06-20 NOTE — Progress Notes (Signed)
A and O x3. Report to RN. Tolerated MAC anesthesia well.Teeth unchanged after procedure. 

## 2016-06-21 ENCOUNTER — Encounter: Payer: Self-pay | Admitting: Family Medicine

## 2016-06-23 ENCOUNTER — Telehealth: Payer: Self-pay | Admitting: *Deleted

## 2016-06-23 NOTE — Telephone Encounter (Signed)
  Follow up Call-  Call back number 06/20/2016  Post procedure Call Back phone  # [phone]  Permission to leave phone message Yes  Some recent data might be hidden     Patient questions:  Do you have a fever, pain , or abdominal swelling? No. Pain Score  0 *  Have you tolerated food without any problems? Yes.    Have you been able to return to your normal activities? Yes.    Do you have any questions about your discharge instructions: Diet   No. Medications  No. Follow up visit  No.  Do you have questions or concerns about your Care? No.  Actions: * If pain score is 4 or above: No action needed, pain <4.

## 2016-06-26 ENCOUNTER — Encounter: Payer: Self-pay | Admitting: Gastroenterology

## 2016-07-04 ENCOUNTER — Encounter: Payer: Self-pay | Admitting: Family Medicine

## 2016-07-21 ENCOUNTER — Other Ambulatory Visit: Payer: Self-pay | Admitting: Family Medicine

## 2016-08-22 ENCOUNTER — Encounter: Payer: Self-pay | Admitting: Family Medicine

## 2016-08-22 ENCOUNTER — Ambulatory Visit (INDEPENDENT_AMBULATORY_CARE_PROVIDER_SITE_OTHER): Payer: BLUE CROSS/BLUE SHIELD | Admitting: Family Medicine

## 2016-08-22 VITALS — BP 138/88 | HR 80 | Temp 98.0°F | Resp 16 | Ht 64.0 in | Wt 168.0 lb

## 2016-08-22 DIAGNOSIS — B9789 Other viral agents as the cause of diseases classified elsewhere: Secondary | ICD-10-CM | POA: Diagnosis not present

## 2016-08-22 DIAGNOSIS — J069 Acute upper respiratory infection, unspecified: Secondary | ICD-10-CM

## 2016-08-22 MED ORDER — FLUTICASONE PROPIONATE 50 MCG/ACT NA SUSP: 2.0000 | Freq: Every day | NASAL | 3 refills | Status: DC

## 2016-08-22 NOTE — Assessment & Plan Note (Signed)
Anticipate viral given short duration - ILI vs sinusitis. Tends to get bacterial sinusitis - will treat with flonase and nasal saline. Update next week with effect - if ongoing sinusitis sxs, treat with abx. Pt agrees with plan. Discussed tamiflu. Not likely necessary.

## 2016-08-22 NOTE — Progress Notes (Signed)
Pre-visit discussion using our clinic review tool. No additional management support is needed unless otherwise documented below in the visit note.  

## 2016-08-22 NOTE — Progress Notes (Signed)
BP 138/88   Pulse 80   Temp 98 F (36.7 C) (Oral)   Resp 16   Ht 5\' 4"  (1.626 m)   Wt 168 lb (76.2 kg)   SpO2 98%   BMI 28.84 kg/m    CC: body aches  Subjective:    Patient ID: Darren Clark, male    DOB: 12-03-1965, 51 y.o.   MRN: [id number]  HPI: Darren Clark is a 51 y.o. male presenting on 08/22/2016 for Generalized Body Aches (chills, running nose, fullness started 1 week ago )   1d h/o body aches. Mild ST, sinus congestion and facial pressure/pain x 3 days. No body aches today. No cough. Some malaise over the last 1.5 weeks. Worse symptoms at night time. Some chills last night but did not check temperature.   Nyquil helps.  GF sick at home. Flu exposure at work.  Ex smoker. Smokes E-cig.  No h/o asthma/COPD.  Did not receive flu shot this year.   Relevant past medical, surgical, family and social history reviewed and updated as indicated. Interim medical history since our last visit reviewed. Allergies and medications reviewed and updated. Current Outpatient Prescriptions on File Prior to Visit  Medication Sig  . acetaminophen (TYLENOL) 500 MG tablet Take 500 mg by mouth as needed.  . clobetasol cream (TEMOVATE) [id number] % Apply 1 application topically 2 (two) times daily. Apply to AA  . omeprazole (PRILOSEC) 40 MG capsule TAKE 1 CAPSULE TWICE A DAY  . SUMAtriptan (IMITREX) 100 MG tablet Take 0.5-1 tablets (50-100 mg total) by mouth as directed. Take med. May repeat in 2 hours x1 if headache persists or recurs.   Current Facility-Administered Medications on File Prior to Visit  Medication  . 0.9 %  sodium chloride infusion    Review of Systems Per HPI unless specifically indicated in ROS section     Objective:    BP 138/88   Pulse 80   Temp 98 F (36.7 C) (Oral)   Resp 16   Ht 5\' 4"  (1.626 m)   Wt 168 lb (76.2 kg)   SpO2 98%   BMI 28.84 kg/m   Wt Readings from Last 3 Encounters:  08/22/16 168 lb (76.2 kg)  06/20/16 166 lb (75.3 kg)  05/23/16 166 lb (75.3  kg)    Physical Exam  Constitutional: He appears well-developed and well-nourished. No distress.  HENT:  Head: Normocephalic and atraumatic.  Right Ear: Hearing, tympanic membrane, external ear and ear canal normal.  Left Ear: Hearing, external ear and ear canal normal.  Nose: Mucosal edema and rhinorrhea present. Right sinus exhibits no maxillary sinus tenderness and no frontal sinus tenderness. Left sinus exhibits no maxillary sinus tenderness and no frontal sinus tenderness.  Mouth/Throat: Uvula is midline, oropharynx is clear and moist and mucous membranes are normal. No oropharyngeal exudate, posterior oropharyngeal edema, posterior oropharyngeal erythema or tonsillar abscesses.  Fluid behind L TM  Eyes: Conjunctivae and EOM are normal. Pupils are equal, round, and reactive to light. No scleral icterus.  Neck: Normal range of motion. Neck supple.  Cardiovascular: Normal rate, regular rhythm, normal heart sounds and intact distal pulses.   No murmur heard. Pulmonary/Chest: Effort normal and breath sounds normal. No respiratory distress. He has no wheezes. He has no rales.  Lymphadenopathy:    He has no cervical adenopathy.  Skin: Skin is warm and dry. No rash noted.  Nursing note and vitals reviewed.     Assessment & Plan:   Problem List Items Addressed This  Visit    Viral URI    Anticipate viral given short duration - ILI vs sinusitis. Tends to get bacterial sinusitis - will treat with flonase and nasal saline. Update next week with effect - if ongoing sinusitis sxs, treat with abx. Pt agrees with plan. Discussed tamiflu. Not likely necessary.           Follow up plan: Return if symptoms worsen or fail to improve.  Ria Bush, MD

## 2016-08-22 NOTE — Patient Instructions (Addendum)
I think this is likely viral respiratory infection or flu like illness along with some sinus congestion - treat with continued nyquil and nasal steroid and nasal saline. Watch for worsening symptoms, fever >101, worsening productive cough.

## 2016-11-13 ENCOUNTER — Encounter: Payer: Self-pay | Admitting: Family Medicine

## 2016-11-13 ENCOUNTER — Ambulatory Visit (INDEPENDENT_AMBULATORY_CARE_PROVIDER_SITE_OTHER): Payer: BLUE CROSS/BLUE SHIELD | Admitting: Family Medicine

## 2016-11-13 VITALS — BP 144/84 | HR 76 | Temp 97.6°F | Wt 168.8 lb

## 2016-11-13 DIAGNOSIS — J019 Acute sinusitis, unspecified: Secondary | ICD-10-CM | POA: Insufficient documentation

## 2016-11-13 MED ORDER — AMOXICILLIN-POT CLAVULANATE 875-125 MG PO TABS: 1.0000 | ORAL_TABLET | Freq: Two times a day (BID) | ORAL | 0 refills | Status: AC

## 2016-11-13 NOTE — Patient Instructions (Signed)
You have a sinus infection, likely viral as early on. Continue flonase.  Push fluids and plenty of rest. Nasal saline irrigation or neti pot to help drain sinuses. May use plain mucinex with plenty of fluid to help mobilize mucous. If ongoing symptoms into Saturday/Sunday (past 10 days), fill antibiotic provided today.  Please let us know if fever >101.5, trouble opening/closing mouth, difficulty swallowing, or worsening instead of improving as expected.

## 2016-11-13 NOTE — Progress Notes (Signed)
BP (!) 144/84   Pulse 76   Temp 97.6 F (36.4 C) (Oral)   Wt 168 lb 12 oz (76.5 kg)   SpO2 96%   BMI 28.97 kg/m    CC: "I think I have sinus infection" Subjective:    Patient ID: Darren Clark, male    DOB: 01-07-1966, 51 y.o.   MRN: [id number]  HPI: Darren Clark is a 51 y.o. male presenting on 11/13/2016 for Sinusitis (sinus pressure and teeth hurt x 6 days)   1 wk h/o sinus pressure/pain localized to TMJs, describes dull ache. Bad taste in mouth. More nasal as well. Mild malaise. ST initially.   Treating with tylenol and flonase.  No fevers/chills, ear pain, PNdrainage, cough.  No sick contacts at home. No h/o asthma. h/o recurrent sinus infections s/p nasal sinus surgery.   Saw dentist this morning - told teeth were ok.   Relevant past medical, surgical, family and social history reviewed and updated as indicated. Interim medical history since our last visit reviewed. Allergies and medications reviewed and updated. Outpatient Medications Prior to Visit  Medication Sig Dispense Refill  . acetaminophen (TYLENOL) 500 MG tablet Take 500 mg by mouth as needed.    . fluticasone (FLONASE) 50 MCG/ACT nasal spray Place 2 sprays into both nostrils daily. 16 g 3  . omeprazole (PRILOSEC) 40 MG capsule TAKE 1 CAPSULE TWICE A DAY 60 capsule 5  . SUMAtriptan (IMITREX) 100 MG tablet Take 0.5-1 tablets (50-100 mg total) by mouth as directed. Take med. May repeat in 2 hours x1 if headache persists or recurs. 10 tablet 4  . clobetasol cream (TEMOVATE) 4.40 % Apply 1 application topically 2 (two) times daily. Apply to [place] (Patient not taking: Reported on 11/13/2016) 30 g 0  . 0.9 %  sodium chloride infusion      No facility-administered medications prior to visit.      Per HPI unless specifically indicated in ROS section below Review of Systems     Objective:    BP (!) 144/84   Pulse 76   Temp 97.6 F (36.4 C) (Oral)   Wt 168 lb 12 oz (76.5 kg)   SpO2 96%   BMI 28.97 kg/m   Wt  Readings from Last 3 Encounters:  11/13/16 168 lb 12 oz (76.5 kg)  08/22/16 168 lb (76.2 kg)  06/20/16 166 lb (75.3 kg)    Physical Exam  Constitutional: He appears well-developed and well-nourished. No distress.  HENT:  Head: Normocephalic and atraumatic.  Right Ear: Hearing, tympanic membrane, external ear and ear canal normal.  Left Ear: Hearing, tympanic membrane, external ear and ear canal normal.  Nose: Nose normal. No mucosal edema or rhinorrhea. Right sinus exhibits no maxillary sinus tenderness and no frontal sinus tenderness. Left sinus exhibits no maxillary sinus tenderness and no frontal sinus tenderness.  Mouth/Throat: Uvula is midline, oropharynx is clear and moist and mucous membranes are normal. No oropharyngeal exudate, posterior oropharyngeal edema, posterior oropharyngeal erythema or tonsillar abscesses.  Nasal mucosal pallor  Eyes: Conjunctivae and EOM are normal. Pupils are equal, round, and reactive to light. No scleral icterus.  Neck: Normal range of motion. Neck supple.  Cardiovascular: Normal rate, regular rhythm, normal heart sounds and intact distal pulses.   No murmur heard. Pulmonary/Chest: Effort normal and breath sounds normal. No respiratory distress. He has no wheezes. He has no rales.  Lymphadenopathy:    He has no cervical adenopathy.  Skin: Skin is warm and dry. No rash noted.  Nursing note and vitals reviewed.     Assessment & Plan:   Problem List Items Addressed This Visit    Acute sinusitis - Primary    Anticipate viral given short duration. Supportive care reviewed. WASP for augmentin printed for patient with indications on when to fill. Pt agrees with plan.       Relevant Medications   amoxicillin-clavulanate (AUGMENTIN) 875-125 MG tablet       Follow up plan: Return if symptoms worsen or fail to improve.  Ria Bush, MD

## 2016-11-13 NOTE — Progress Notes (Signed)
Pre visit review using our clinic review tool, if applicable. No additional management support is needed unless otherwise documented below in the visit note. 

## 2016-11-13 NOTE — Assessment & Plan Note (Signed)
Anticipate viral given short duration. Supportive care reviewed. WASP for augmentin printed for patient with indications on when to fill. Pt agrees with plan.

## 2016-11-30 ENCOUNTER — Other Ambulatory Visit: Payer: Self-pay | Admitting: Family Medicine

## 2016-12-01 ENCOUNTER — Ambulatory Visit (INDEPENDENT_AMBULATORY_CARE_PROVIDER_SITE_OTHER): Payer: BLUE CROSS/BLUE SHIELD | Admitting: Family Medicine

## 2016-12-01 ENCOUNTER — Encounter: Payer: Self-pay | Admitting: Family Medicine

## 2016-12-01 VITALS — BP 124/92 | HR 77 | Temp 98.2°F | Wt 167.5 lb

## 2016-12-01 DIAGNOSIS — B9689 Other specified bacterial agents as the cause of diseases classified elsewhere: Secondary | ICD-10-CM

## 2016-12-01 DIAGNOSIS — J208 Acute bronchitis due to other specified organisms: Secondary | ICD-10-CM | POA: Diagnosis not present

## 2016-12-01 MED ORDER — AZITHROMYCIN 250 MG PO TABS: ORAL_TABLET | ORAL | 0 refills | Status: DC

## 2016-12-01 MED ORDER — GUAIFENESIN-CODEINE 100-10 MG/5ML PO SYRP: 5.0000 mL | ORAL_SOLUTION | Freq: Two times a day (BID) | ORAL | 0 refills | Status: DC | PRN

## 2016-12-01 NOTE — Patient Instructions (Addendum)
I think you do have developing bronchitis - viral vs bacterial. Treat with robitussin during the day, use cheratussin for night time cough. Push fluids and rest. zpack antibiotic to cover bronchitis.  Let us know if worsening symptoms instead of improving.  This will take some time to get better.

## 2016-12-01 NOTE — Progress Notes (Signed)
Pre visit review using our clinic review tool, if applicable. No additional management support is needed unless otherwise documented below in the visit note. 

## 2016-12-01 NOTE — Assessment & Plan Note (Signed)
Acute bronchitis in setting of recent sinusitis treatment with augmentin course. Given recent abx, higher risk for bacterial infection - will cover with zpack.  Discussed robitussin during day, cheratussin at night.  Discussed further supportive care.

## 2016-12-01 NOTE — Progress Notes (Signed)
BP (!) 124/92 (BP Location: Right Arm, Patient Position: Sitting, Cuff Size: Normal)   Pulse 77   Temp 98.2 F (36.8 C) (Oral)   Wt 167 lb 8 oz (76 kg)   PF 94 L/min   BMI 28.75 kg/m    CC: cough, congestion Subjective:    Patient ID: Darren Clark, male    DOB: August 03, 1965, 51 y.o.   MRN: [id number]  HPI: Darren Clark is a 51 y.o. male presenting on 12/01/2016 for Cough; Facial Pain; and Nasal Congestion   Seen 2.5 wks ago with acute sinusitis, treated with augmentin 10d course. Symptoms mostly resolved. Then yesterday again started feeling ill - facial pain, body aches, coughing. No fever. + chills. Today actually feeling better. Chest > head congestion.   OTC robitussin helping cough as well as nyquil.  No sick contacts at home.   Relevant past medical, surgical, family and social history reviewed and updated as indicated. Interim medical history since our last visit reviewed. Allergies and medications reviewed and updated. Outpatient Medications Prior to Visit  Medication Sig Dispense Refill  . acetaminophen (TYLENOL) 500 MG tablet Take 500 mg by mouth as needed.    . fluticasone (FLONASE) 50 MCG/ACT nasal spray Place 2 sprays into both nostrils daily. 16 g 3  . omeprazole (PRILOSEC) 40 MG capsule TAKE 1 CAPSULE TWICE A DAY 60 capsule 5  . SUMAtriptan (IMITREX) 100 MG tablet Take 0.5-1 tablets (50-100 mg total) by mouth as directed. Take med. May repeat in 2 hours x1 if headache persists or recurs. 10 tablet 4   No facility-administered medications prior to visit.      Per HPI unless specifically indicated in ROS section below Review of Systems     Objective:    BP (!) 124/92 (BP Location: Right Arm, Patient Position: Sitting, Cuff Size: Normal)   Pulse 77   Temp 98.2 F (36.8 C) (Oral)   Wt 167 lb 8 oz (76 kg)   PF 94 L/min   BMI 28.75 kg/m   Wt Readings from Last 3 Encounters:  12/01/16 167 lb 8 oz (76 kg)  11/13/16 168 lb 12 oz (76.5 kg)  08/22/16 168 lb  (76.2 kg)    Physical Exam  Constitutional: He appears well-developed and well-nourished. No distress.  HENT:  Head: Normocephalic and atraumatic.  Right Ear: Hearing, tympanic membrane, external ear and ear canal normal.  Left Ear: Hearing, tympanic membrane, external ear and ear canal normal.  Nose: Mucosal edema and rhinorrhea present. Right sinus exhibits no maxillary sinus tenderness and no frontal sinus tenderness. Left sinus exhibits no maxillary sinus tenderness and no frontal sinus tenderness.  Mouth/Throat: Uvula is midline, oropharynx is clear and moist and mucous membranes are normal. No oropharyngeal exudate, posterior oropharyngeal edema, posterior oropharyngeal erythema or tonsillar abscesses.  Eyes: Conjunctivae and EOM are normal. Pupils are equal, round, and reactive to light. No scleral icterus.  Neck: Normal range of motion. Neck supple.  Cardiovascular: Normal rate, regular rhythm, normal heart sounds and intact distal pulses.   No murmur heard. Pulmonary/Chest: Effort normal and breath sounds normal. No respiratory distress. He has no wheezes. He has no rales.  Lungs clear, harsh cough present  Lymphadenopathy:    He has no cervical adenopathy.  Skin: Skin is warm and dry. No rash noted.  Nursing note and vitals reviewed.  Results for orders placed or performed in visit on 02/13/16  Lipid panel  Result Value Ref Range   Cholesterol 179 0 - 200  mg/dL   Triglycerides 216.0 (H) 0.0 - 149.0 mg/dL   HDL 29.80 (L) >39.00 mg/dL   VLDL 43.2 (H) 0.0 - 40.0 mg/dL   Total CHOL/HDL Ratio 6    NonHDL 814.48   Basic metabolic panel  Result Value Ref Range   Sodium 141 135 - 145 mEq/L   Potassium 4.5 3.5 - 5.1 mEq/L   Chloride 105 96 - 112 mEq/L   CO2 28 19 - 32 mEq/L   Glucose, Bld 90 70 - 99 mg/dL   BUN 14 6 - 23 mg/dL   Creatinine, Ser 0.93 0.40 - 1.50 mg/dL   Calcium 9.6 8.4 - 10.5 mg/dL   GFR 91.19 >60.00 mL/min  VITAMIN D 25 Hydroxy (Vit-D Deficiency, Fractures)    Result Value Ref Range   VITD 27.24 (L) 30.00 - 100.00 ng/mL  Vitamin B12  Result Value Ref Range   Vitamin B-12 365 211 - 911 pg/mL  LDL cholesterol, direct  Result Value Ref Range   Direct LDL 111.0 mg/dL      Assessment & Plan:   Problem List Items Addressed This Visit    Acute bacterial bronchitis - Primary    Acute bronchitis in setting of recent sinusitis treatment with augmentin course. Given recent abx, higher risk for bacterial infection - will cover with zpack.  Discussed robitussin during day, cheratussin at night.  Discussed further supportive care.       Relevant Medications   azithromycin (ZITHROMAX) 250 MG tablet       Follow up plan: Return if symptoms worsen or fail to improve.  Ria Bush, MD

## 2016-12-18 ENCOUNTER — Other Ambulatory Visit: Payer: Self-pay

## 2016-12-18 MED ORDER — OMEPRAZOLE 40 MG PO CPDR: 40.0000 mg | DELAYED_RELEASE_CAPSULE | Freq: Two times a day (BID) | ORAL | 0 refills | Status: DC

## 2016-12-18 NOTE — Telephone Encounter (Signed)
[facility] left v/m requesting # 180 on omeprazole. Last annual 02/13/16. Refill # 180 x 0 with note pt needs to call for appt.

## 2017-01-26 ENCOUNTER — Other Ambulatory Visit: Payer: Self-pay | Admitting: Family Medicine

## 2017-01-26 NOTE — Telephone Encounter (Signed)
Received refill electronically Last refill 02/13/16 #10/4 Last office visit 12/01/16

## 2017-04-12 ENCOUNTER — Other Ambulatory Visit: Payer: Self-pay | Admitting: Family Medicine

## 2017-10-22 ENCOUNTER — Other Ambulatory Visit: Payer: Self-pay | Admitting: Family Medicine

## 2017-10-22 NOTE — Telephone Encounter (Signed)
PT stated his pharmacy told him if he called office he may be able to get refill sooner  As he is worried about running out over the weekend and having headaches

## 2018-01-04 ENCOUNTER — Other Ambulatory Visit: Payer: Self-pay | Admitting: Neurological Surgery

## 2018-01-04 DIAGNOSIS — M545 Low back pain: Secondary | ICD-10-CM

## 2018-01-08 ENCOUNTER — Ambulatory Visit
Admission: RE | Admit: 2018-01-08 | Discharge: 2018-01-08 | Disposition: A | Discharge status: Home / Self Care | Payer: BLUE CROSS/BLUE SHIELD | Source: Ambulatory Visit | Attending: Neurological Surgery | Admitting: Neurological Surgery

## 2018-01-08 DIAGNOSIS — M545 Low back pain: Secondary | ICD-10-CM

## 2018-03-19 ENCOUNTER — Encounter: Payer: Self-pay | Admitting: Family Medicine

## 2018-03-19 ENCOUNTER — Ambulatory Visit: Payer: BLUE CROSS/BLUE SHIELD | Admitting: Family Medicine

## 2018-03-19 VITALS — BP 132/82 | HR 71 | Temp 97.8°F | Ht 64.0 in | Wt 157.2 lb

## 2018-03-19 DIAGNOSIS — I619 Nontraumatic intracerebral hemorrhage, unspecified: Secondary | ICD-10-CM

## 2018-03-19 DIAGNOSIS — G8190 Hemiplegia, unspecified affecting unspecified side: Secondary | ICD-10-CM

## 2018-03-19 DIAGNOSIS — Z87891 Personal history of nicotine dependence: Secondary | ICD-10-CM | POA: Diagnosis not present

## 2018-03-19 NOTE — Patient Instructions (Addendum)
Sign release for records of recent hospitalization at [facility].  Let me know if we need referral to establish with a neurologist. Dr Ronnald Ramp is a neurosurgeon.  Return as needed or in 2-3 months for follow up visit.

## 2018-03-19 NOTE — Progress Notes (Signed)
BP 132/82 (BP Location: Right Arm, Cuff Size: Normal)   Pulse 71   Temp 97.8 F (36.6 C) (Oral)   Ht 5\' 4"  (1.626 m)   Wt 157 lb 4 oz (71.3 kg)   SpO2 97%   BMI 26.99 kg/m    CC: hosp f/u CVA 01/2018 Subjective:    Patient ID: Darren Clark, male    DOB: 1966-07-17, 52 y.o.   MRN: [id number]  HPI: Darren Clark is a 52 y.o. male presenting on 03/19/2018 for BP Follow up (Pt has had a stroke 02/08/18 and was told to follow up with PCP and for BP. Will be scheduled for neuro f/u. Pt accompanied by Glo Herring. Pt provided copy of d/c summary. )   Here with fiancee.   Recent hospitalization at [facility] for R frontal intracerebral hemorrhage presumed due to hypertension. Hospitalized 02/08/2018, discharged to rehab 02/19/2018, discharged home 03/17/2018. D/C patient instructions provided and reviewed. No D/C summary currently available. Started on amantadine 100mg  bid, baclofen PRN, donepezil 5mg  daily, melatonin for sleep, metoprolol 25mg  1.5 tab bid, and omeprazole 20mg  bid. They have been taking metoprolol 25mg  1 tab bid.   Residual R hemiparesis, slurred speech improving. Hand with flexion contractions. Strength slowly improving. Not driving. In wheelchair today. Has 4 prong cane he can use as well as R leg drop brace. Has bedside toilet.   Denies fevers/chills, abd pain, chest pain or dyspnea, constipation or diarrhea. Voiding well.   Neurosurgery f/u with Dr Ronnald Ramp (pending).  CVA happened while at work. Has worker's comp [employer] information [employer].  Working on getting further therapy - awaiting determination for [facility] vs outpatient.   Not checking BP at home - they don't have cuff.   Relevant past medical, surgical, family and social history reviewed and updated as indicated. Interim medical history since our last visit reviewed. Allergies and medications reviewed and updated. Outpatient Medications Prior to Visit  Medication Sig Dispense Refill  . amantadine (SYMMETREL) 100 MG  capsule Take 1 capsule by mouth 2 (two) times daily.  0  . baclofen (LIORESAL) 10 MG tablet Take 1 tablet by mouth 4 (four) times daily.  0  . donepezil (ARICEPT) 5 MG tablet Take 1 tablet by mouth daily.  0  . Melatonin 3 MG TABS Take 1 tablet by mouth at bedtime.    . metoprolol tartrate (LOPRESSOR) 25 MG tablet Take 1.5 tablets by mouth 2 (two) times daily.  0  . omeprazole (PRILOSEC) 40 MG capsule TAKE 1 CAPSULE BY MOUTH TWICE A DAY 180 capsule 5  . SUMAtriptan (IMITREX) 100 MG tablet TAKE HALF OR 1 WHOLE TABLET BY MOUTH AS DIRECTED, MAY REPEAT IN 2 HOURS 1 TIME IF HEADACHE PERSISTS (Patient taking differently: As needed) 10 tablet 4  . acetaminophen (TYLENOL) 500 MG tablet Take 500 mg by mouth as needed.    Marland Kitchen azithromycin (ZITHROMAX) 250 MG tablet Take two tablets on day one followed by one tablet on days 2-5 6 each 0  . fluticasone (FLONASE) 50 MCG/ACT nasal spray Place 2 sprays into both nostrils daily. 16 g 3  . guaiFENesin-codeine (CHERATUSSIN AC) 100-10 MG/5ML syrup Take 5 mLs by mouth 2 (two) times daily as needed for cough (sedation precautions). 120 mL 0   No facility-administered medications prior to visit.      Per HPI unless specifically indicated in ROS section below Review of Systems     Objective:    BP 132/82 (BP Location: Right Arm, Cuff Size: Normal)  Pulse 71   Temp 97.8 F (36.6 C) (Oral)   Ht 5\' 4"  (1.626 m)   Wt 157 lb 4 oz (71.3 kg)   SpO2 97%   BMI 26.99 kg/m   Wt Readings from Last 3 Encounters:  03/19/18 157 lb 4 oz (71.3 kg)  12/01/16 167 lb 8 oz (76 kg)  11/13/16 168 lb 12 oz (76.5 kg)    Physical Exam  Constitutional: He appears well-developed and well-nourished.  In wheelchair  HENT:  Head: Normocephalic and atraumatic.  Mouth/Throat: Oropharynx is clear and moist. No oropharyngeal exudate.  Cardiovascular: Normal rate, regular rhythm and normal heart sounds.  No murmur heard. Pulmonary/Chest: Effort normal and breath sounds normal. No  respiratory distress. He has no wheezes. He has no rales.  Musculoskeletal: He exhibits no edema.  R foot drop brace in place  Neurological: He is alert. No sensory deficit.  R facial weakness present Marked R side hemiparesis present  Skin: No rash noted.  Psychiatric: He has a normal mood and affect.  Nursing note and vitals reviewed.  Results for orders placed or performed in visit on 02/13/16  Lipid panel  Result Value Ref Range   Cholesterol 179 0 - 200 mg/dL   Triglycerides 216.0 (H) 0.0 - 149.0 mg/dL   HDL 29.80 (L) >39.00 mg/dL   VLDL 43.2 (H) 0.0 - 40.0 mg/dL   Total CHOL/HDL Ratio 6    NonHDL 505.39   Basic metabolic panel  Result Value Ref Range   Sodium 141 135 - 145 mEq/L   Potassium 4.5 3.5 - 5.1 mEq/L   Chloride 105 96 - 112 mEq/L   CO2 28 19 - 32 mEq/L   Glucose, Bld 90 70 - 99 mg/dL   BUN 14 6 - 23 mg/dL   Creatinine, Ser 0.93 0.40 - 1.50 mg/dL   Calcium 9.6 8.4 - 10.5 mg/dL   GFR 91.19 >60.00 mL/min  VITAMIN D 25 Hydroxy (Vit-D Deficiency, Fractures)  Result Value Ref Range   VITD 27.24 (L) 30.00 - 100.00 ng/mL  Vitamin B12  Result Value Ref Range   Vitamin B-12 365 211 - 911 pg/mL  LDL cholesterol, direct  Result Value Ref Range   Direct LDL 111.0 mg/dL      Assessment & Plan:   Problem List Items Addressed This Visit    Ex-smoker    He fully quit smoking after cerebral hemorrhage 01/2018      Cerebral hemorrhage with hemiparesis ([place]) - Primary    I have very limited data on recent hospitalization - will request records today to review.  They think they are to follow up with neurosurgery and are awaiting appt to be scheduled.  BP stable on current regimen, but I did ask them to increase metoprolol to 1.5 tablets bid. He will also continue amantadine and donepezil.  I also recommend neurology f/u. Will await records and review prior to placing referral unless they let me know sooner this is needed.  He needs ongoing therapy - they are awaiting on  determination between home health and outpatient - I asked them to let me know when they find out. Low threshold to re-refer.           No orders of the defined types were placed in this encounter.  No orders of the defined types were placed in this encounter.   Follow up plan: Return in about 3 months (around 06/19/2018) for follow up visit.  Ria Bush, MD

## 2018-03-19 NOTE — Assessment & Plan Note (Addendum)
I have very limited data on recent hospitalization - will request records today to review.  They think they are to follow up with neurosurgery and are awaiting appt to be scheduled.  BP stable on current regimen, but I did ask them to increase metoprolol to 1.5 tablets bid. He will also continue amantadine and donepezil.  I also recommend neurology f/u. Will await records and review prior to placing referral unless they let me know sooner this is needed.  He needs ongoing therapy - they are awaiting on determination between home health and outpatient - I asked them to let me know when they find out. Low threshold to re-refer.

## 2018-03-20 ENCOUNTER — Encounter: Payer: Self-pay | Admitting: Family Medicine

## 2018-03-20 NOTE — Assessment & Plan Note (Signed)
He fully quit smoking after cerebral hemorrhage 01/2018

## 2018-03-24 ENCOUNTER — Inpatient Hospital Stay: Payer: BLUE CROSS/BLUE SHIELD | Admitting: Family Medicine

## 2018-03-27 ENCOUNTER — Telehealth: Payer: Self-pay | Admitting: Family Medicine

## 2018-03-27 DIAGNOSIS — I619 Nontraumatic intracerebral hemorrhage, unspecified: Secondary | ICD-10-CM

## 2018-03-27 DIAGNOSIS — G8190 Hemiplegia, unspecified affecting unspecified side: Secondary | ICD-10-CM

## 2018-03-27 NOTE — Telephone Encounter (Signed)
plz call GF - I received and reviewed hospitalization records. Looks like he needs neurosurgery f/u - would start with this - is this scheduled? Has he been set up with home health PT/OT/ST? How is he doing?

## 2018-03-30 NOTE — Telephone Encounter (Signed)
Left message for pt to call back.  Need to relay Dr. G's message and get answers to his questions.  

## 2018-03-31 NOTE — Telephone Encounter (Signed)
Spoke with pt's girlfriend, Hoyle Sauer (on dpr), relaying Dr. Synthia Innocent message. Says neuro f/u is 04/08/18 in [facility].  States pt is not scheduled for any home therapies yet but pt has been contacted and things are in the works. Says pt is doing good. He got a new brace for his leg and pt is walking better.   Also, Hoyle Sauer is asking about applying for SS disability for pt. Says the insurance agent directed pt to check with his doctor to see how to get started. Pt and Hoyle Sauer give permission to lvm with Dr. Synthia Innocent response.

## 2018-04-01 NOTE — Telephone Encounter (Addendum)
I'm glad he has new brace. I'm not sure about disability but I believe if they are going through worker's comp, this should go through his work. If not, they should contact the social security office and start there.

## 2018-04-01 NOTE — Telephone Encounter (Signed)
Spoke with pt relaying Dr. G's message.  Verbalizes understanding.  

## 2018-04-06 ENCOUNTER — Telehealth: Payer: Self-pay | Admitting: Family Medicine

## 2018-04-06 NOTE — Telephone Encounter (Signed)
Attempted to return Christine's call. No answer. Vm box is full.  Need to inform her Dr. Darnell Level gives verbal orders for therapy requests for pt.

## 2018-04-06 NOTE — Telephone Encounter (Signed)
Copied from [place] [id number]. Topic: General - Other >> Apr 06, 2018 10:46 AM Janace Aris A wrote: Reason for CRM: Altha Harm called in Requesting Verbal Orders from Dr. Oneita Jolly she is working with patient on PT,OT and Speech Therapy, Says they will continue to work on gait training, balance training, and home exercise programs.   Would like to see patient 2x's a week for 3 Weeks.   [phone] (Can leave Detailed VM)

## 2018-04-06 NOTE — Telephone Encounter (Signed)
Agree with this. Thanks.  

## 2018-04-07 NOTE — Telephone Encounter (Signed)
Spoke with Darren Clark informing her Dr. Darnell Level gives verbal orders for therapies requested for pt.  Verbalizes understanding and will fax POC for Dr. Darnell Level to sign.

## 2018-04-09 ENCOUNTER — Telehealth: Payer: Self-pay

## 2018-04-09 NOTE — Telephone Encounter (Signed)
Copied from [place] [phone]. Topic: General - Other >> Apr 09, 2018  3:55 PM Judyann Munson wrote: Reason for CRM:    [facility] care is calling to request orders Home health occupational  therapy 2 times a week for 2 weeks Best contact # [phone]

## 2018-04-10 NOTE — Telephone Encounter (Signed)
Agree with these verbal orders thanks.  

## 2018-04-12 ENCOUNTER — Telehealth: Payer: Self-pay | Admitting: Family Medicine

## 2018-04-12 MED ORDER — BACLOFEN 10 MG PO TABS: 10.0000 mg | ORAL_TABLET | Freq: Four times a day (QID) | ORAL | 1 refills | Status: DC

## 2018-04-12 MED ORDER — DONEPEZIL HCL 5 MG PO TABS: 5.0000 mg | ORAL_TABLET | Freq: Every day | ORAL | 1 refills | Status: DC

## 2018-04-12 MED ORDER — OMEPRAZOLE 40 MG PO CPDR: 40.0000 mg | DELAYED_RELEASE_CAPSULE | Freq: Two times a day (BID) | ORAL | 1 refills | Status: DC

## 2018-04-12 MED ORDER — METOPROLOL TARTRATE 25 MG PO TABS: 37.5000 mg | ORAL_TABLET | Freq: Two times a day (BID) | ORAL | 1 refills | Status: DC

## 2018-04-12 NOTE — Telephone Encounter (Signed)
Left message on vm for Santiago Glad (?) of [place] her Dr. Darnell Level gives verbal orders for OT for pt.

## 2018-04-12 NOTE — Telephone Encounter (Signed)
Baclofen, metoprolol donepezil last rx:  03/16/18  Omeprazole last rx:  04/13/17, #180 Last OV:  03/19/18 Next OV:  06/21/18

## 2018-04-12 NOTE — Telephone Encounter (Signed)
plz notify this was sent in. 

## 2018-04-12 NOTE — Telephone Encounter (Signed)
Spoke with pt notifying him refills were sent to the pharmacy. Verbalizes understanding and expresses his thanks.

## 2018-04-12 NOTE — Telephone Encounter (Signed)
Copied from [place] [phone]. Topic: Quick Communication - Rx Refill/Question >> Apr 12, 2018  9:12 AM Margot Ables wrote: Medication: baclofen, donepezil, metoprolol, and omeprazole - pt will be out on Thursday - meds were initially prescribed at [facility] after pt had a stroke - Hoyle Sauer states they were advised to call the office when refills were needed Has the patient contacted their pharmacy? Yes - advised to call MD as well  Preferred Pharmacy (with phone number or street name): [id number] - [place], [place] - [address] [phone] (Phone) [phone] (Fax)

## 2018-04-21 ENCOUNTER — Telehealth: Payer: Self-pay | Admitting: Family Medicine

## 2018-04-21 NOTE — Telephone Encounter (Signed)
Spoke to pt and advised OV required. States he will have his girlfriend contact office back and schedule

## 2018-04-21 NOTE — Telephone Encounter (Signed)
Copied from [place] [phone]. Topic: Quick Communication - See Telephone Encounter >> Apr 21, 2018 12:23 PM Bea Graff, NT wrote: CRM for notification. See Telephone encounter for: 04/21/18. Pts girlfriend calling and states pt is having trouble sleeping and would like to see if a sleep medication can be ordered? [id number] - [place], [place] - [address] [phone] (Phone) [phone] (Fax)

## 2018-04-23 DIAGNOSIS — Z9181 History of falling: Secondary | ICD-10-CM

## 2018-04-23 DIAGNOSIS — K219 Gastro-esophageal reflux disease without esophagitis: Secondary | ICD-10-CM

## 2018-04-23 DIAGNOSIS — I69120 Aphasia following nontraumatic intracerebral hemorrhage: Secondary | ICD-10-CM

## 2018-04-23 DIAGNOSIS — I69151 Hemiplegia and hemiparesis following nontraumatic intracerebral hemorrhage affecting right dominant side: Secondary | ICD-10-CM

## 2018-04-23 DIAGNOSIS — I10 Essential (primary) hypertension: Secondary | ICD-10-CM

## 2018-06-11 ENCOUNTER — Telehealth: Payer: Self-pay | Admitting: Family Medicine

## 2018-06-11 NOTE — Telephone Encounter (Signed)
plz clarify what therapy is needed?  Is this for PT/OT? Do we need speech therapy as well?

## 2018-06-11 NOTE — Telephone Encounter (Signed)
Left message on vm for Hoyle Sauer, recreational therapist with [facility].  I asked her to call back to let Dr. Darnell Level know if pt also needs PT, OT or speech therapy also.

## 2018-06-11 NOTE — Telephone Encounter (Signed)
Need an order for recreational therapy eval and treat please fax to [phone]

## 2018-06-14 NOTE — Telephone Encounter (Signed)
[facility]/Recreational Therapist called office stating the pt is already scheduled with them for the PT and OT. In addition thew pt is needing the recreational therapy as well.

## 2018-06-16 NOTE — Telephone Encounter (Signed)
Ok to do

## 2018-06-16 NOTE — Telephone Encounter (Signed)
Left message on vm for Hoyle Sauer of [facility] informing her Dr. Darnell Level is giving verbal orders for recreational therapy.

## 2018-06-21 ENCOUNTER — Ambulatory Visit: Payer: BLUE CROSS/BLUE SHIELD | Admitting: Family Medicine

## 2018-06-26 ENCOUNTER — Encounter: Payer: Self-pay | Admitting: Family Medicine

## 2018-06-26 NOTE — Progress Notes (Signed)
BP 118/64 (BP Location: Left Arm, Patient Position: Sitting, Cuff Size: Normal)   Pulse 90   Temp 97.8 F (36.6 C) (Oral)   Ht 5\' 4"  (1.626 m)   Wt 167 lb 12 oz (76.1 kg)   SpO2 94%   BMI 28.79 kg/m    CC: follow up visit  Subjective:    Patient ID: Darren Clark, male    DOB: 10-23-1965, 52 y.o.   MRN: [id number]  HPI: Darren Clark is a 52 y.o. male presenting on 06/28/2018 for Follow-up (Here for 3 mo f/u. Pt acccompanied by his fiance, Hoyle Sauer. )   See prior note for details. Suffered L frontal intracerebral hemorrhage 01/2018 ?HTN related (found down at work). Had residual R hemiparesis and slurred speech.   Receiving rehab in [place], OT, speech, recreational rehab.   Has been released from neurosurgery (Dr Ronnald Ramp). Continues seeing rehab specialists.  Has seen neurologist at [facility]. hy  Since headache, notes improvement in headaches.  Saw podiatrist on Thursday.   Insomnia - melatonin caused headache. Takes naps during the day.   Denies significant depression/anxiety  Reviewed records from [facility] hospitalization 01/2018 - MRI did not show cause of intraparenchymal hemorrhage (no occlusion or aneurysm/AVM. Deficits included R hemiplegia, impaired vision, impaired cognition, expressive aphasia, impaired balance and hypertonicity R sided extremities   Relevant past medical, surgical, family and social history reviewed and updated as indicated. Interim medical history since our last visit reviewed. Allergies and medications reviewed and updated. Outpatient Medications Prior to Visit  Medication Sig Dispense Refill  . amantadine (SYMMETREL) 100 MG capsule Take 1 capsule by mouth 2 (two) times daily.  0  . baclofen (LIORESAL) 10 MG tablet Take 1 tablet (10 mg total) by mouth 4 (four) times daily. (Patient taking differently: Take 10 mg by mouth 4 (four) times daily. Takes 1 tablet 3 times a day and 2 at bedtime) 360 each 1  . donepezil  (ARICEPT) 5 MG tablet Take 1 tablet (5 mg total) by mouth daily. 90 tablet 1  . metoprolol tartrate (LOPRESSOR) 25 MG tablet Take 1.5 tablets (37.5 mg total) by mouth 2 (two) times daily. 270 tablet 1  . omeprazole (PRILOSEC) 40 MG capsule Take 1 capsule (40 mg total) by mouth 2 (two) times daily. 180 capsule 1  . Melatonin 3 MG TABS Take 1 tablet by mouth at bedtime.     No facility-administered medications prior to visit.      Per HPI unless specifically indicated in ROS section below Review of Systems     Objective:    BP 118/64 (BP Location: Left Arm, Patient Position: Sitting, Cuff Size: Normal)   Pulse 90   Temp 97.8 F (36.6 C) (Oral)   Ht 5\' 4"  (1.626 m)   Wt 167 lb 12 oz (76.1 kg)   SpO2 94%   BMI 28.79 kg/m   Wt Readings from Last 3 Encounters:  06/28/18 167 lb 12 oz (76.1 kg)  03/19/18 157 lb 4 oz (71.3 kg)  12/01/16 167 lb 8 oz (76 kg)    Physical Exam  Constitutional: He appears well-developed and well-nourished. No distress.  Presents in wheelchair  HENT:  Mouth/Throat: Oropharynx is clear and moist. No oropharyngeal exudate.  Cardiovascular: Normal rate, regular rhythm and normal heart sounds.  No murmur heard. Pulmonary/Chest: Effort normal and breath sounds normal. No respiratory distress. He has no wheezes. He has no rales.  Musculoskeletal: He exhibits no edema.  R  foot drop brace in place  Neurological: He is alert.  Profound R hemiparesis  Skin: Skin is warm and dry. No rash noted.  Psychiatric: He has a normal mood and affect.  Nursing note and vitals reviewed.     Assessment & Plan:   Problem List Items Addressed This Visit    Slurred speech    With expressive aphasia, continue ST.      Insomnia    Ongoing struggle - does take daytime naps. Stopped melatonin due to headaches. Discussed sleep hygiene, rec limit daytime napping, encouraged good light exposure during the day. Will trial trazodone 25-50mg  nightly for sleep.       Essential  hypertension    Well controlled on metoprolol 37.5mg  bid. Continue. a      Cerebral hemorrhage with hemiparesis ([place]) - Primary    I reviewed hospital records but have not received neurosurgery, neurology [facility]) or PM&R records - requested today. Still unclear cause of hemorrhage/accident. Continue rehab therapy and current medicines.           Meds ordered this encounter  Medications  . DISCONTD: hydrOXYzine (ATARAX/VISTARIL) 25 MG tablet    Sig: Take 0.5-1 tablets (12.5-25 mg total) by mouth at bedtime as needed (sleeping).    Dispense:  30 tablet    Refill:  1  . traZODone (DESYREL) 50 MG tablet    Sig: Take 0.5-1 tablets (25-50 mg total) by mouth at bedtime as needed for sleep.    Dispense:  30 tablet    Refill:  3  . naproxen sodium (ALEVE) 220 MG tablet    Sig: Take 1 tablet (220 mg total) by mouth daily as needed.   No orders of the defined types were placed in this encounter.   Follow up plan: No follow-ups on file.  Ria Bush, MD

## 2018-06-28 ENCOUNTER — Encounter: Payer: Self-pay | Admitting: Family Medicine

## 2018-06-28 ENCOUNTER — Ambulatory Visit (INDEPENDENT_AMBULATORY_CARE_PROVIDER_SITE_OTHER): Payer: BLUE CROSS/BLUE SHIELD | Admitting: Family Medicine

## 2018-06-28 VITALS — BP 118/64 | HR 90 | Temp 97.8°F | Ht 64.0 in | Wt 167.8 lb

## 2018-06-28 DIAGNOSIS — R4781 Slurred speech: Secondary | ICD-10-CM

## 2018-06-28 DIAGNOSIS — G4700 Insomnia, unspecified: Secondary | ICD-10-CM | POA: Diagnosis not present

## 2018-06-28 DIAGNOSIS — I619 Nontraumatic intracerebral hemorrhage, unspecified: Secondary | ICD-10-CM | POA: Diagnosis not present

## 2018-06-28 DIAGNOSIS — G8190 Hemiplegia, unspecified affecting unspecified side: Secondary | ICD-10-CM

## 2018-06-28 DIAGNOSIS — I10 Essential (primary) hypertension: Secondary | ICD-10-CM | POA: Diagnosis not present

## 2018-06-28 MED ORDER — TRAZODONE HCL 50 MG PO TABS: 25.0000 mg | ORAL_TABLET | Freq: Every evening | ORAL | 3 refills | Status: DC | PRN

## 2018-06-28 MED ORDER — HYDROXYZINE HCL 25 MG PO TABS: 12.5000 mg | ORAL_TABLET | Freq: Every evening | ORAL | 1 refills | Status: DC | PRN

## 2018-06-28 MED ORDER — NAPROXEN SODIUM 220 MG PO TABS: 220.0000 mg | ORAL_TABLET | Freq: Every day | ORAL | Status: DC | PRN

## 2018-06-28 NOTE — Assessment & Plan Note (Addendum)
I reviewed hospital records but have not received neurosurgery, neurology [facility]) or PM&R records - requested today. Still unclear cause of hemorrhage/accident. Continue rehab therapy and current medicines.

## 2018-06-28 NOTE — Assessment & Plan Note (Signed)
Well controlled on metoprolol 37.5mg  bid. Continue. a

## 2018-06-28 NOTE — Assessment & Plan Note (Signed)
Ongoing struggle - does take daytime naps. Stopped melatonin due to headaches. Discussed sleep hygiene, rec limit daytime napping, encouraged good light exposure during the day. Will trial trazodone 25-50mg  nightly for sleep.

## 2018-06-28 NOTE — Assessment & Plan Note (Signed)
Previous headaches have resolved.

## 2018-06-28 NOTE — Assessment & Plan Note (Signed)
With expressive aphasia, continue ST.

## 2018-06-28 NOTE — Patient Instructions (Addendum)
Sign release for records from [facility]) as well as rehab ([facility] in [place]).  Try trazodone 25-50mg  at bedtime for sleep.  Try to limit daytime naps, try to get most sleep at night. Ensure you are exposed to bright light during the day.  Return in 3-4 months for follow up visit.

## 2018-07-17 ENCOUNTER — Telehealth: Payer: Self-pay | Admitting: Family Medicine

## 2018-07-17 NOTE — Telephone Encounter (Signed)
Received notice that we need to complete PA for metoprolol, donepezil and baclofen. plz complete PA for these meds. Forms placed in Lisa's box.

## 2018-07-19 ENCOUNTER — Telehealth: Payer: Self-pay

## 2018-07-19 NOTE — Telephone Encounter (Signed)
Dr. Darnell Level received a letter from [facility] requesting PAs for baclofen, donepezil and metoprolol tartrate.   Submitted PAs for: -baclofen 10 mg tab, key: [id number] -donepezil 5 mg tab, key: [id number] -metoprolol tartrate 25 mg tab, key:  [id number] Decisions pending.

## 2018-07-19 NOTE — Telephone Encounter (Signed)
Submitted PAs.  See TE, 07/19/18.

## 2018-07-22 ENCOUNTER — Other Ambulatory Visit: Payer: Self-pay | Admitting: Family Medicine

## 2018-09-26 ENCOUNTER — Encounter: Payer: Self-pay | Admitting: Family Medicine

## 2018-09-26 NOTE — Progress Notes (Signed)
BP 118/78 (BP Location: Left Arm, Patient Position: Sitting, Cuff Size: Normal)   Pulse 75   Temp 97.6 F (36.4 C) (Oral)   Ht 5\' 4"  (1.626 m)   Wt 174 lb 2 oz (79 kg)   SpO2 97%   BMI 29.89 kg/m    CC: 3 mo f/u visit Subjective:    Patient ID: Darren Clark, male    DOB: 12/04/1965, 53 y.o.   MRN: [id number]  HPI: Darren Clark is a 53 y.o. male presenting on 09/27/2018 for Follow-up (Here for 3-4 mo f/u.  Pt accompanied by his significant other, Hoyle Sauer.)   Suffered L frontal intracerebral hemorrhage 01/2018 (found down at work) with residual R hemiparesis, slurred speech with expressive aphasia, impaired vision and cognitionand imbalance. MRI did not show cause of intraparenchymal hemorrhage. Received rehab in [place] (PT, OT, speech rec rehab).   Insomnia - last visit we started trazodone. Takes 1/2 tab QHS PRN  Continues receiving PT/OT, speech and rec rehab at [facility].  Sees PMR Dr Hilliard Clark at [facility]   Seen at [facility] 09/14/2018 with weakness, dx with near syncope and dyspnea. No changes to meds made. Episode occurred while in the shower sitting on the chair where he felt he was pulled back with inability to speak "ie couldn't yell out for help". No LOC with this. This in setting of taking too much baclofen (had ran out of 10mg  dose for 3 days then accidentally took 2 20mg  tablets at once).   Using quad cane for ambulation, uses wheelchair for prolonged trips out.      Relevant past medical, surgical, family and social history reviewed and updated as indicated. Interim medical history since our last visit reviewed. Allergies and medications reviewed and updated. Outpatient Medications Prior to Visit  Medication Sig Dispense Refill  . amantadine (SYMMETREL) 100 MG capsule Take 1 capsule by mouth 2 (two) times daily.  0  . baclofen (LIORESAL) 20 MG tablet Take 20 mg by mouth 4 (four) times daily.    Marland Kitchen donepezil (ARICEPT) 5 MG tablet Take 1 tablet (5 mg  total) by mouth daily. 90 tablet 1  . naproxen sodium (ALEVE) 220 MG tablet Take 1 tablet (220 mg total) by mouth daily as needed.    Marland Kitchen omeprazole (PRILOSEC) 40 MG capsule Take 1 capsule (40 mg total) by mouth 2 (two) times daily. 180 capsule 1  . traZODone (DESYREL) 50 MG tablet Take 0.5-1 tablets (25-50 mg total) by mouth at bedtime as needed for sleep. 30 tablet 3  . metoprolol tartrate (LOPRESSOR) 25 MG tablet Take 1.5 tablets (37.5 mg total) by mouth 2 (two) times daily. 270 tablet 1  . baclofen (LIORESAL) 10 MG tablet Take 1 tablet (10 mg total) by mouth 4 (four) times daily. (Patient taking differently: Take 10 mg by mouth 4 (four) times daily. Takes 1 tablet 3 times a day and 2 at bedtime) 360 each 1   No facility-administered medications prior to visit.      Per HPI unless specifically indicated in ROS section below Review of Systems Objective:    BP 118/78 (BP Location: Left Arm, Patient Position: Sitting, Cuff Size: Normal)   Pulse 75   Temp 97.6 F (36.4 C) (Oral)   Ht 5\' 4"  (1.626 m)   Wt 174 lb 2 oz (79 kg)   SpO2 97%   BMI 29.89 kg/m   Wt Readings from Last 3 Encounters:  09/27/18 174 lb 2 oz (79 kg)  06/28/18 167 lb 12 oz (76.1 kg)  03/19/18 157 lb 4 oz (71.3 kg)    Physical Exam Vitals signs and nursing note reviewed.  Constitutional:      Appearance: Normal appearance. He is not ill-appearing.     Comments: Presents in wheelchair  HENT:     Mouth/Throat:     Mouth: Mucous membranes are moist.     Pharynx: No posterior oropharyngeal erythema.  Eyes:     Extraocular Movements: Extraocular movements intact.     Conjunctiva/sclera: Conjunctivae normal.     Pupils: Pupils are equal, round, and reactive to light.  Cardiovascular:     Rate and Rhythm: Normal rate and regular rhythm.     Pulses: Normal pulses.     Heart sounds: Normal heart sounds. No murmur.  Pulmonary:     Effort: Pulmonary effort is normal. No respiratory distress.     Breath sounds: Normal  breath sounds. No wheezing, rhonchi or rales.  Musculoskeletal:        General: No swelling.     Comments: Wearing foot drop ankle brace  Neurological:     Mental Status: He is alert.     Comments: Profound R hemiparesis.  R facial droop, able to raise bilateral eyebrows Slowed gait with quad cane  Slowed but intact speech  Psychiatric:        Mood and Affect: Mood normal.        Behavior: Behavior normal.       Results for orders placed or performed in visit on 02/13/16  Lipid panel  Result Value Ref Range   Cholesterol 179 0 - 200 mg/dL   Triglycerides 216.0 (H) 0.0 - 149.0 mg/dL   HDL 29.80 (L) >39.00 mg/dL   VLDL 43.2 (H) 0.0 - 40.0 mg/dL   Total CHOL/HDL Ratio 6    NonHDL 124.58   Basic metabolic panel  Result Value Ref Range   Sodium 141 135 - 145 mEq/L   Potassium 4.5 3.5 - 5.1 mEq/L   Chloride 105 96 - 112 mEq/L   CO2 28 19 - 32 mEq/L   Glucose, Bld 90 70 - 99 mg/dL   BUN 14 6 - 23 mg/dL   Creatinine, Ser 0.93 0.40 - 1.50 mg/dL   Calcium 9.6 8.4 - 10.5 mg/dL   GFR 91.19 >60.00 mL/min  VITAMIN D 25 Hydroxy (Vit-D Deficiency, Fractures)  Result Value Ref Range   VITD 27.24 (L) 30.00 - 100.00 ng/mL  Vitamin B12  Result Value Ref Range   Vitamin B-12 365 211 - 911 pg/mL  LDL cholesterol, direct  Result Value Ref Range   Direct LDL 111.0 mg/dL   Assessment & Plan:   Problem List Items Addressed This Visit    Insomnia    Doing better with trazodone QHS PRN - continue.      HLD (hyperlipidemia)    Check FLP when returns for physical at upcoming appt      Relevant Medications   metoprolol tartrate (LOPRESSOR) 25 MG tablet   Essential hypertension    Chronic, stable. Continue current regimen.      Relevant Medications   metoprolol tartrate (LOPRESSOR) 25 MG tablet   Cerebral hemorrhage with hemiparesis ([place]) - Primary    Continues outpatient PT/OT through [facility]. Progress noted in ambulation and speech. Awaiting neurology, neurosurgery, PMR records. Have  requested again today..           Meds ordered this encounter  Medications  . metoprolol tartrate (LOPRESSOR) 25 MG tablet  Sig: Take 1.5 tablets (37.5 mg total) by mouth 2 (two) times daily.    Dispense:  270 tablet    Refill:  1   No orders of the defined types were placed in this encounter.   Follow up plan: Return in about 3 months (around 12/28/2018) for annual exam, prior fasting for blood work.  Ria Bush, MD

## 2018-09-27 ENCOUNTER — Encounter: Payer: Self-pay | Admitting: Family Medicine

## 2018-09-27 ENCOUNTER — Ambulatory Visit: Payer: Self-pay | Admitting: Family Medicine

## 2018-09-27 VITALS — BP 118/78 | HR 75 | Temp 97.6°F | Ht 64.0 in | Wt 174.1 lb

## 2018-09-27 DIAGNOSIS — E785 Hyperlipidemia, unspecified: Secondary | ICD-10-CM

## 2018-09-27 DIAGNOSIS — I619 Nontraumatic intracerebral hemorrhage, unspecified: Secondary | ICD-10-CM

## 2018-09-27 DIAGNOSIS — G4700 Insomnia, unspecified: Secondary | ICD-10-CM

## 2018-09-27 DIAGNOSIS — G8190 Hemiplegia, unspecified affecting unspecified side: Secondary | ICD-10-CM

## 2018-09-27 DIAGNOSIS — I10 Essential (primary) hypertension: Secondary | ICD-10-CM

## 2018-09-27 MED ORDER — METOPROLOL TARTRATE 25 MG PO TABS: 37.5000 mg | ORAL_TABLET | Freq: Two times a day (BID) | ORAL | 1 refills | Status: DC

## 2018-09-27 NOTE — Patient Instructions (Addendum)
Sign release for records from Dr Hilliard Clark at [place] (PM&R).  Sign release for records from [facility] for recent visit to ER.  Sign release for records from [facility]) Continue current medicines.  Return in 3-4 months for physical.

## 2018-09-27 NOTE — Assessment & Plan Note (Signed)
Check FLP when returns for physical at upcoming appt

## 2018-09-27 NOTE — Assessment & Plan Note (Signed)
Doing better with trazodone QHS PRN - continue.

## 2018-09-27 NOTE — Assessment & Plan Note (Addendum)
Chronic, stable. Continue current regimen. 

## 2018-09-27 NOTE — Assessment & Plan Note (Addendum)
Continues outpatient PT/OT through [facility]. Progress noted in ambulation and speech. Awaiting neurology, neurosurgery, PMR records. Have requested again today.Marland Kitchen

## 2019-01-10 ENCOUNTER — Encounter: Payer: Self-pay | Admitting: Family Medicine

## 2019-03-24 ENCOUNTER — Telehealth: Payer: Self-pay | Admitting: Family Medicine

## 2019-03-24 NOTE — Telephone Encounter (Signed)
Darren Clark @health  information resources called To schedule a time with Dr Darnell Level to speak to Darren Clark regarding pt plan of care [name] will be faxing a consent form   Best number [phone]

## 2019-03-25 NOTE — Telephone Encounter (Signed)
Received faxed health Authorization to [facility] for pt.  Placed in Dr. Synthia Innocent box.  Spoke with [name] informing him fax was received and per Dr. Darnell Level he can speak with Ivin Booty one day next week after 5:00 PM.   Earlene Plater understanding and wants to schedule the call for Wed, 03/30/19.  Dr. Darnell Level can call Oliver Pila at [phone].

## 2019-03-25 NOTE — Telephone Encounter (Signed)
Spoke with [name] relaying Dr. Synthia Innocent message.  Verbalizes understanding.

## 2019-03-25 NOTE — Telephone Encounter (Signed)
plz call - may schedule time when we receive consent. I have not received yet.

## 2019-03-27 ENCOUNTER — Other Ambulatory Visit: Payer: Self-pay | Admitting: Family Medicine

## 2019-03-30 MED ORDER — METOPROLOL TARTRATE 25 MG PO TABS: 37.5000 mg | ORAL_TABLET | Freq: Two times a day (BID) | ORAL | 1 refills | Status: AC

## 2019-03-30 MED ORDER — TRAZODONE HCL 50 MG PO TABS: 25.0000 mg | ORAL_TABLET | Freq: Every evening | ORAL | 3 refills | Status: AC | PRN

## 2019-03-30 MED ORDER — DONEPEZIL HCL 5 MG PO TABS: 5.0000 mg | ORAL_TABLET | Freq: Every day | ORAL | 1 refills | Status: DC

## 2019-03-30 NOTE — Addendum Note (Signed)
Addended by: Ria Bush on: 03/30/2019 05:18 PM   Modules accepted: Orders

## 2019-03-30 NOTE — Telephone Encounter (Addendum)
Darren Clark is his medical case Freight forwarder.  Spoke with her - apparently pt continues receiving PT through [facility] but has not seen PMR.  I advised I have limited records since [place]. She will try to get more info from specialist evals and forward to me.  I last saw patient 09/2018 - f/u scheduled next month. Reviewing chart, it seems he is due for med refills - will reorder.

## 2019-05-17 ENCOUNTER — Ambulatory Visit (INDEPENDENT_AMBULATORY_CARE_PROVIDER_SITE_OTHER): Payer: Self-pay | Admitting: Family Medicine

## 2019-05-17 ENCOUNTER — Encounter: Payer: Self-pay | Admitting: Family Medicine

## 2019-05-17 ENCOUNTER — Other Ambulatory Visit: Payer: Self-pay

## 2019-05-17 VITALS — BP 140/84 | HR 75 | Temp 98.2°F | Ht 65.0 in | Wt 187.4 lb

## 2019-05-17 DIAGNOSIS — G8190 Hemiplegia, unspecified affecting unspecified side: Secondary | ICD-10-CM

## 2019-05-17 DIAGNOSIS — E559 Vitamin D deficiency, unspecified: Secondary | ICD-10-CM

## 2019-05-17 DIAGNOSIS — Z125 Encounter for screening for malignant neoplasm of prostate: Secondary | ICD-10-CM

## 2019-05-17 DIAGNOSIS — E785 Hyperlipidemia, unspecified: Secondary | ICD-10-CM

## 2019-05-17 DIAGNOSIS — I10 Essential (primary) hypertension: Secondary | ICD-10-CM

## 2019-05-17 DIAGNOSIS — I619 Nontraumatic intracerebral hemorrhage, unspecified: Secondary | ICD-10-CM

## 2019-05-17 DIAGNOSIS — K219 Gastro-esophageal reflux disease without esophagitis: Secondary | ICD-10-CM

## 2019-05-17 DIAGNOSIS — Z0000 Encounter for general adult medical examination without abnormal findings: Secondary | ICD-10-CM

## 2019-05-17 MED ORDER — FAMOTIDINE 20 MG PO TABS: 20.0000 mg | ORAL_TABLET | Freq: Every day | ORAL | Status: AC

## 2019-05-17 MED ORDER — ACETAMINOPHEN 500 MG PO TABS: 500.0000 mg | ORAL_TABLET | Freq: Two times a day (BID) | ORAL | Status: AC | PRN

## 2019-05-17 NOTE — Assessment & Plan Note (Signed)
Preventative protocols reviewed and updated unless pt declined. Discussed healthy diet and lifestyle.  

## 2019-05-17 NOTE — Progress Notes (Signed)
This visit was conducted in person.  BP 140/84 (BP Location: Left Arm, Patient Position: Sitting, Cuff Size: Normal)    Pulse 75    Temp 98.2 F (36.8 C) (Temporal)    Ht 5\' 5"  (1.651 m)    Wt 187 lb 6 oz (85 kg)    SpO2 95%    BMI 31.18 kg/m    CC: CPE Subjective:    Patient ID: Darren Clark, male    DOB: 1966-05-05, 53 y.o.   MRN: [id number]  HPI: Darren Clark is a 53 y.o. male presenting on 05/17/2019 for Annual Exam (Pt accompanied by significant other, Hoyle Sauer. 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   Noticing worsening GERD at night - chocolate related, despite omeprazole 40mg  bid. Drinks a lot of cheer wine, junk food. Takes aleve PRN (about 1-2 times a week).   Continues seeing Dr Hilliard Clark PMR in [place]. Aricept and amantadine were stopped. Having trouble filling baclofen due to poor insurance coverage, tried tizanidine with benefit.   Completed outpatient PT/OT. Home aide (through [employer]) continues working with him - using recumbent bicycle.   Preventative: COLONOSCOPY 06/2016 - HP, diverticulosis, rpt 10 yrs ([place]) Prostate cancer screening - would like yearly screen. Minimal nocturia.  Flu shot - declines Tdap 2012  Seat belt use discussed Sunscreen use discussed. No changing moles on skin.  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 Interim medical history since our last visit reviewed. Allergies and medications reviewed and updated. Outpatient Medications Prior to Visit  Medication Sig Dispense Refill   baclofen (LIORESAL) 20 MG tablet Take 20 mg by mouth 4 (four) times daily.     metoprolol tartrate (LOPRESSOR) 25 MG tablet Take 1.5 tablets (37.5 mg total) by mouth 2 (two) times daily. 270 tablet 1   omeprazole (PRILOSEC) 40 MG capsule TAKE 1 CAPSULE BY MOUTH TWICE A DAY 180 capsule 2   traZODone (DESYREL) 50 MG tablet Take 0.5-1 tablets (25-50 mg total) by mouth at bedtime as needed for sleep. 30 tablet 3   naproxen sodium (ALEVE) 220 MG tablet Take 1 tablet (220 mg total) by mouth daily as needed.     amantadine (SYMMETREL) 100 MG capsule Take 1 capsule by mouth 2 (two) times daily.  0   donepezil (ARICEPT) 5 MG tablet Take 1 tablet (5 mg total) by mouth daily. 90 tablet 1   No facility-administered medications prior to visit.      Per HPI unless specifically indicated in ROS section below Review of Systems  Constitutional: Negative for activity change, appetite change, chills, fatigue, fever and unexpected weight change.  HENT: Negative for hearing loss.   Eyes: Negative for visual disturbance.  Respiratory: Negative for cough, chest tightness, shortness of breath and wheezing.   Cardiovascular: Negative for chest pain, palpitations and leg swelling.  Gastrointestinal: Negative for abdominal distention, abdominal pain, blood in stool, constipation, diarrhea, nausea and vomiting.  Genitourinary: Negative  for difficulty urinating and hematuria.  Musculoskeletal: Negative for arthralgias, myalgias and neck pain.  Skin: Negative for rash.  Neurological: Positive for headaches. Negative for dizziness, seizures and syncope.  Hematological: Negative for adenopathy. Does not bruise/bleed easily.  Psychiatric/Behavioral: Negative for dysphoric mood. The patient is not nervous/anxious.    Objective:     BP 140/84 (BP Location: Left Arm, Patient Position: Sitting, Cuff Size: Normal)    Pulse 75    Temp 98.2 F (36.8 C) (Temporal)    Ht 5\' 5"  (1.651 m)    Wt 187 lb 6 oz (85 kg)    SpO2 95%    BMI 31.18 kg/m   Wt Readings from Last 3 Encounters:  05/17/19 187 lb 6 oz (85 kg)  09/27/18 174 lb 2 oz (79 kg)  06/28/18 167 lb 12 oz (76.1 kg)    Physical Exam Vitals signs and nursing note reviewed.  Constitutional:      General: He is not in acute distress.    Appearance: Normal appearance. He is well-developed. He is not ill-appearing.  HENT:     Head: Normocephalic and atraumatic.     Right Ear: Hearing, tympanic membrane, ear canal and external ear normal.     Left Ear: Hearing, tympanic membrane, ear canal and external ear normal.     Nose: Nose normal.     Mouth/Throat:     Mouth: Mucous membranes are moist.     Pharynx: Oropharynx is clear. Uvula midline. No posterior oropharyngeal erythema.  Eyes:     General: No scleral icterus.    Extraocular Movements: Extraocular movements intact.     Conjunctiva/sclera: Conjunctivae normal.     Pupils: Pupils are equal, round, and reactive to light.  Neck:     Musculoskeletal: Normal range of motion and neck supple.  Cardiovascular:     Rate and Rhythm: Normal rate and regular rhythm.     Pulses: Normal pulses.          Radial pulses are 2+ on the right side and 2+ on the left side.     Heart sounds: Normal heart sounds. No murmur.  Pulmonary:     Effort: Pulmonary effort is normal. No respiratory distress.     Breath sounds: Normal breath sounds. No wheezing, rhonchi or rales.  Abdominal:     General: Abdomen is flat. Bowel sounds are normal. There is no distension.     Palpations: Abdomen is soft. There is no mass.     Tenderness: There is no abdominal tenderness. There is no guarding or rebound.     Hernia: No hernia is present.  Musculoskeletal: Normal range of motion.     Right lower leg: No edema.     Left lower leg: No edema.    Lymphadenopathy:     Cervical: No cervical adenopathy.  Skin:    General: Skin is warm and dry.     Findings: No rash.  Neurological:     General: No focal deficit present.     Mental Status: He is alert and oriented to person, place, and time.     Comments: CN grossly intact, station and gait intact  Psychiatric:        Mood and Affect: Mood normal.        Behavior: Behavior normal.        Thought Content: Thought content normal.        Judgment: Judgment normal.       Results for orders placed or performed in  visit on 02/13/16  Lipid panel  Result Value Ref Range   Cholesterol 179 0 - 200 mg/dL   Triglycerides 216.0 (H) 0.0 - 149.0 mg/dL   HDL 29.80 (L) >39.00 mg/dL   VLDL 43.2 (H) 0.0 - 40.0 mg/dL   Total CHOL/HDL Ratio 6    NonHDL 99991111   Basic metabolic panel  Result Value Ref Range   Sodium 141 135 - 145 mEq/L   Potassium 4.5 3.5 - 5.1 mEq/L   Chloride 105 96 - 112 mEq/L   CO2 28 19 - 32 mEq/L   Glucose, Bld 90 70 - 99 mg/dL   BUN 14 6 - 23 mg/dL   Creatinine, Ser 0.93 0.40 - 1.50 mg/dL   Calcium 9.6 8.4 - 10.5 mg/dL   GFR 91.19 >60.00 mL/min  VITAMIN D 25 Hydroxy (Vit-D Deficiency, Fractures)  Result Value Ref Range   VITD 27.24 (L) 30.00 - 100.00 ng/mL  Vitamin B12  Result Value Ref Range   Vitamin B-12 365 211 - 911 pg/mL  LDL cholesterol, direct  Result Value Ref Range   Direct LDL 111.0 mg/dL  No results found for: PSA  Assessment & Plan:   Problem List Items Addressed This Visit    Vitamin D deficiency    Continue vit D daily - update level.      Relevant Orders   VITAMIN D 25 Hydroxy (Vit-D Deficiency, Fractures)   HLD (hyperlipidemia)    Update FLP today, not on statin. The ASCVD Risk score Mikey Bussing DC Jr., et al., 2013) failed to calculate for the following reasons:   Cannot find a previous HDL lab   Cannot find a previous total cholesterol lab       Relevant Orders   Lipid panel   Comprehensive metabolic panel   TSH   Health  maintenance examination - Primary    Preventative protocols reviewed and updated unless pt declined. Discussed healthy diet and lifestyle.       GERD (gastroesophageal reflux disease)    Continues omeprazole 40mg  BID - with some breakthrough symptoms. He had stable EGD 2017. Will add pepcid at night PRN. He does use aleve intermittently - rec transition to tylenol PRN joint pains. Consider return to GI if ongoing despite above.       Relevant Medications   famotidine (PEPCID) 20 MG tablet   Essential hypertension    Chronic, stable. Continue current regimen.       Relevant Orders   Microalbumin / creatinine urine ratio   Cerebral hemorrhage with hemiparesis ([place])    Will request latest records from [facility] in [place]. Appreciate Dr Rup's care.        Other Visit Diagnoses    Special screening for malignant neoplasm of prostate       Relevant Orders   PSA       Meds ordered this encounter  Medications   acetaminophen (TYLENOL) 500 MG tablet    Sig: Take 1 tablet (500 mg total) by mouth 2 (two) times daily as needed for headache.   famotidine (PEPCID) 20 MG tablet    Sig: Take 1 tablet (20 mg total) by mouth at bedtime.   Orders Placed This Encounter  Procedures   PSA   Lipid panel   Comprehensive metabolic panel   TSH   VITAMIN D 25 Hydroxy (Vit-D Deficiency, Fractures)   Microalbumin / creatinine urine ratio    Patient instructions: Blood work today Try to back off aleve and in its place may use tylenol.  If ongoing heartburn trouble, may add pepcid 20mg  OTC at night.  Sign release form up front for latest notes from Dr Hilliard Clark.  Good to see you today, call us with questions. Return as needed or in 6 months for follow up visit.   Follow up plan: Return in about 6 months (around 11/15/2019) for follow up visit.  Ria Bush, MD

## 2019-05-17 NOTE — Patient Instructions (Addendum)
Blood work today Try to back [employer] and in its place may use tylenol.  If ongoing heartburn trouble, may add pepcid 20mg  OTC at night.  Sign release form up front for latest notes from Dr Hilliard Clark.  Good to see you today, call us with questions. Return as needed or in 6 months for follow up visit.   Health Maintenance, Male Adopting a healthy lifestyle and getting preventive care are important in promoting health and wellness. Ask your health care provider about:  The right schedule for you to have regular tests and exams.  Things you can do on your own to prevent diseases and keep yourself healthy. What should I know about diet, weight, and exercise? Eat a healthy diet   Eat a diet that includes plenty of vegetables, fruits, low-fat dairy products, and lean protein.  Do not eat a lot of foods that are high in solid fats, added sugars, or sodium. Maintain a healthy weight Body mass index (BMI) is a measurement that can be used to identify possible weight problems. It estimates body fat based on height and weight. Your health care provider can help determine your BMI and help you achieve or maintain a healthy weight. Get regular exercise Get regular exercise. This is one of the most important things you can do for your health. Most adults should:  Exercise for at least 150 minutes each week. The exercise should increase your heart rate and make you sweat (moderate-intensity exercise).  Do strengthening exercises at least twice a week. This is in addition to the moderate-intensity exercise.  Spend less time sitting. Even light physical activity can be beneficial. Watch cholesterol and blood lipids Have your blood tested for lipids and cholesterol at 54 years of age, then have this test every 5 years. You may need to have your cholesterol levels checked more often if:  Your lipid or cholesterol levels are high.  You are older than 54 years of age.  You are at high risk for heart  disease. What should I know about cancer screening? Many types of cancers can be detected early and may often be prevented. Depending on your health history and family history, you may need to have cancer screening at various ages. This may include screening for:  Colorectal cancer.  Prostate cancer.  Skin cancer.  Lung cancer. What should I know about heart disease, diabetes, and high blood pressure? Blood pressure and heart disease  High blood pressure causes heart disease and increases the risk of stroke. This is more likely to develop in people who have high blood pressure readings, are of African descent, or are overweight.  Talk with your health care provider about your target blood pressure readings.  Have your blood pressure checked: ? Every 3-5 years if you are 50-37 years of age. ? Every year if you are 83 years old or older.  If you are between the ages of 68 and 45 and are a current or former smoker, ask your health care provider if you should have a one-time screening for abdominal aortic aneurysm (AAA). Diabetes Have regular diabetes screenings. This checks your fasting blood sugar level. Have the screening done:  Once every three years after age 78 if you are at a normal weight and have a low risk for diabetes.  More often and at a younger age if you are overweight or have a high risk for diabetes. What should I know about preventing infection? Hepatitis B If you have a higher risk  for hepatitis B, you should be screened for this virus. Talk with your health care provider to find out if you are at risk for hepatitis B infection. Hepatitis C Blood testing is recommended for:  Everyone born from [birth year] through 1966.  Anyone with known risk factors for hepatitis C. Sexually transmitted infections (STIs)  You should be screened each year for STIs, including gonorrhea and chlamydia, if: ? You are sexually active and are younger than 54 years of age. ? You are older  than 54 years of age and your health care provider tells you that you are at risk for this type of infection. ? Your sexual activity has changed since you were last screened, and you are at increased risk for chlamydia or gonorrhea. Ask your health care provider if you are at risk.  Ask your health care provider about whether you are at high risk for HIV. Your health care provider may recommend a prescription medicine to help prevent HIV infection. If you choose to take medicine to prevent HIV, you should first get tested for HIV. You should then be tested every 3 months for as long as you are taking the medicine. Follow these instructions at home: Lifestyle  Do not use any products that contain nicotine or tobacco, such as cigarettes, e-cigarettes, and chewing tobacco. If you need help quitting, ask your health care provider.  Do not use street drugs.  Do not share needles.  Ask your health care provider for help if you need support or information about quitting drugs. Alcohol use  Do not drink alcohol if your health care provider tells you not to drink.  If you drink alcohol: ? Limit how much you have to 0-2 drinks a day. ? Be aware of how much alcohol is in your drink. In the [place] one drink equals one 12 oz bottle of beer (355 mL), one 5 oz glass of wine (148 mL), or one 1 oz glass of hard liquor (44 mL). General instructions  Schedule regular health, dental, and eye exams.  Stay current with your vaccines.  Tell your health care provider if: ? You often feel depressed. ? You have ever been abused or do not feel safe at home. Summary  Adopting a healthy lifestyle and getting preventive care are important in promoting health and wellness.  Follow your health care provider's instructions about healthy diet, exercising, and getting tested or screened for diseases.  Follow your health care provider's instructions on monitoring your cholesterol and blood pressure. This  information is not intended to replace advice given to you by your health care provider. Make sure you discuss any questions you have with your health care provider. Document Released: 01/03/2008 Document Revised: 06/30/2018 Document Reviewed: 06/30/2018 Elsevier Patient Education  2020 [employer].

## 2019-05-18 LAB — MICROALBUMIN / CREATININE URINE RATIO
Creatinine,U: 207.9 mg/dL
Microalb Creat Ratio: 0.6 mg/g (ref 0.0–30.0)
Microalb, Ur: 1.3 mg/dL (ref 0.0–1.9)

## 2019-05-18 LAB — COMPREHENSIVE METABOLIC PANEL
ALT: 19 U/L (ref 0–53)
AST: 16 U/L (ref 0–37)
Albumin: 4 g/dL (ref 3.5–5.2)
Alkaline Phosphatase: 107 U/L (ref 39–117)
BUN: 11 mg/dL (ref 6–23)
CO2: 28 mEq/L (ref 19–32)
Calcium: 8.9 mg/dL (ref 8.4–10.5)
Chloride: 103 mEq/L (ref 96–112)
Creatinine, Ser: 0.85 mg/dL (ref 0.40–1.50)
GFR: 93.99 mL/min (ref >60.00)
Glucose, Bld: 119 mg/dL — ABNORMAL HIGH (ref 70–99)
Potassium: 4.3 mEq/L (ref 3.5–5.1)
Sodium: 140 mEq/L (ref 135–145)
Total Bilirubin: 0.4 mg/dL (ref 0.2–1.2)
Total Protein: 6.5 g/dL (ref 6.0–8.3)

## 2019-05-18 LAB — LIPID PANEL
Cholesterol: 168 mg/dL (ref 0–200)
HDL: 27.9 mg/dL — ABNORMAL LOW (ref >39.00)
NonHDL: 140.26
Total CHOL/HDL Ratio: 6
Triglycerides: 375 mg/dL — ABNORMAL HIGH (ref 0.0–149.0)
VLDL: 75 mg/dL — ABNORMAL HIGH (ref 0.0–40.0)

## 2019-05-18 LAB — LDL CHOLESTEROL, DIRECT: Direct LDL: 90 mg/dL

## 2019-05-18 NOTE — Assessment & Plan Note (Signed)
Chronic, stable. Continue current regimen. 

## 2019-05-18 NOTE — Assessment & Plan Note (Signed)
Update FLP today, not on statin. The ASCVD Risk score Mikey Bussing DC Jr., et al., 2013) failed to calculate for the following reasons:   Cannot find a previous HDL lab   Cannot find a previous total cholesterol lab

## 2019-05-18 NOTE — Assessment & Plan Note (Signed)
Will request latest records from [facility] in [place]. Appreciate Dr Rup's care.

## 2019-05-18 NOTE — Assessment & Plan Note (Signed)
Continues omeprazole 40mg  BID - with some breakthrough symptoms. He had stable EGD 2017. Will add pepcid at night PRN. He does use aleve intermittently - rec transition to tylenol PRN joint pains. Consider return to GI if ongoing despite above.

## 2019-05-18 NOTE — Assessment & Plan Note (Signed)
Continue vit D daily - update level.

## 2019-05-19 LAB — TSH: TSH: 2.92 u[IU]/mL (ref 0.35–4.50)

## 2019-05-19 LAB — PSA: PSA: 0.74 ng/mL (ref 0.10–4.00)

## 2019-05-19 LAB — VITAMIN D 25 HYDROXY (VIT D DEFICIENCY, FRACTURES): VITD: 13.97 ng/mL — ABNORMAL LOW (ref 30.00–100.00)

## 2019-05-23 ENCOUNTER — Telehealth: Payer: Self-pay

## 2019-05-23 NOTE — Telephone Encounter (Signed)
Agreed, thanks.  Routed to PCP as FYI.

## 2019-05-23 NOTE — Telephone Encounter (Signed)
Hoyle Sauer contacted the office and stated that they just left the [facility] in [facility]. Hoyle Sauer stated that the ER doctor gave patient Amlodipine, as well as Doxycycline & Flonase for a sinus infection.  The ER advised that the patient be seen here within 2 days - but Hoyle Sauer states that patient was just here on 05/17/19 and wants to see if Dr. Darnell Level agrees with scheduling an appt this soon? I advised that with patient's BP running high, we want to make sure that the medication prescribed in the ER is working and bringing his BP down, but Hoyle Sauer wants to see if Dr. Darnell Level agrees? Please advise. Thanks!

## 2019-05-23 NOTE — Telephone Encounter (Signed)
Darren Clark ([facility] signed) said pts caregiver advised that pts BP over weekend averaged 158/105. This morning BP ranging 161/115, 166/106 and latest BP this AM is 158/105. Pt has had H/A for several days. Starting last night pt has had upper abd pain, breathing hard, SOB upon any exertion, chills, N&V and generalized weakness. No CP, dizziness, fever or diarrhea.no other covid symptoms and no travel or known exposure to covid. Darren Clark is going to take pt to [facility] now. FYI to Dr Darnell Level.

## 2019-05-24 MED ORDER — AMLODIPINE BESYLATE 5 MG PO TABS: 5.0000 mg | ORAL_TABLET | Freq: Every day | ORAL | 3 refills | Status: DC

## 2019-05-24 NOTE — Telephone Encounter (Addendum)
ER records reviewed, head CT was reassuring. Treated for sinusitis. Agree with continuing amlodipine 5mg  daily. If he can monitor BP daily and call us in 1 week with update on BP readings and HA, ok to not schedule f/u appointment.

## 2019-05-24 NOTE — Addendum Note (Signed)
Addended by: Ria Bush on: 05/24/2019 08:22 AM   Modules accepted: Orders

## 2019-05-24 NOTE — Telephone Encounter (Signed)
Noted  

## 2019-05-24 NOTE — Telephone Encounter (Signed)
Pt aware of recommendations per Dr Darnell Level.  Pt will call back in about 1 week with BP readings and an update on his HAs  Pt is doing well at this time. Taking all medications as directed. Will continue his Amlodipine 5mg  as directed as well.   Nothing further needed.

## 2019-05-25 ENCOUNTER — Other Ambulatory Visit: Payer: Self-pay | Admitting: Family Medicine

## 2019-05-25 MED ORDER — VITAMIN D 50 MCG (2000 UT) PO CAPS: 1.0000 | ORAL_CAPSULE | Freq: Every day | ORAL | Status: AC

## 2019-06-09 ENCOUNTER — Encounter: Payer: Self-pay | Admitting: Family Medicine

## 2019-06-09 DIAGNOSIS — G8110 Spastic hemiplegia affecting unspecified side: Secondary | ICD-10-CM | POA: Insufficient documentation

## 2019-06-10 ENCOUNTER — Other Ambulatory Visit: Payer: Self-pay

## 2019-06-10 ENCOUNTER — Ambulatory Visit: Payer: Self-pay | Admitting: Family Medicine

## 2019-06-10 ENCOUNTER — Encounter: Payer: Self-pay | Admitting: Family Medicine

## 2019-06-10 ENCOUNTER — Ambulatory Visit (INDEPENDENT_AMBULATORY_CARE_PROVIDER_SITE_OTHER): Payer: Self-pay | Admitting: Family Medicine

## 2019-06-10 DIAGNOSIS — I69154 Hemiplegia and hemiparesis following nontraumatic intracerebral hemorrhage affecting left non-dominant side: Secondary | ICD-10-CM

## 2019-06-10 DIAGNOSIS — I10 Essential (primary) hypertension: Secondary | ICD-10-CM

## 2019-06-10 NOTE — Patient Instructions (Addendum)
Blood pressures are looking wonderful! Keep up the good work.  Continue both metoprolol and amlodipine.  I'm glad you're doing better. Keep follow up appointment in April.

## 2019-06-10 NOTE — Assessment & Plan Note (Addendum)
Chronic, better controlled with addition of amlodipine - will continue this. Overall feeling well.  He is also doing better after sinusitis treatment with doxycycline course.

## 2019-06-10 NOTE — Progress Notes (Signed)
This visit was conducted in person.  BP 124/80 (BP Location: Left Arm, Patient Position: Sitting, Cuff Size: Normal)   Pulse (!) 101   Temp 97.7 F (36.5 C) (Temporal)   Ht 5\' 5"  (1.651 m)   Wt 182 lb 3 oz (82.6 kg)   SpO2 94%   BMI 30.32 kg/m    CC: ER f/u visit Subjective:    Patient ID: Darren Clark, male    DOB: 1966/06/04, 53 y.o.   MRN: [id number]  HPI: Darren Clark is a 53 y.o. male presenting on 06/10/2019 for Hospitalization Follow-up (Seen at [facility] on 05/23/19.  Provided copy [made copy] of recent BP readings. Pt accompanied by aide, Lanette Hampshire, 97.6].)   Seen at [facility] ER 05/23/2019 for elevated blood pressures in setting of vomiting, headache, leg weakness, with reassuring workup - head CT. Treated for sinusitis with doxycycline for "scattered sinus disease" on CT. This improved sinus pressure and actually seemed to improve headaches as well.   He overall feels well today - working out using arm paddles.   Brings BP log - ranging from 112-140s/80-90s, one low reading 91/774 - asxs at that time. Highest reading 142/95 (05/24/2019).   Compliant with amlodipine 5mg  daily and metoprolol 37.5mg  bid.   I received PMR records from Dr Hilliard Clark - titrating tizanidine in addition to baclofen.      Relevant past medical, surgical, family and social history reviewed and updated as indicated. Interim medical history since our last visit reviewed. Allergies and medications reviewed and updated. Outpatient Medications Prior to Visit  Medication Sig Dispense Refill  . acetaminophen (TYLENOL) 500 MG tablet Take 1 tablet (500 mg total) by mouth 2 (two) times daily as needed for headache.    Marland Kitchen amLODipine (NORVASC) 5 MG tablet Take 1 tablet (5 mg total) by mouth daily. 90 tablet 3  . baclofen (LIORESAL) 20 MG tablet Take 20 mg by mouth 4 (four) times daily.    . Cholecalciferol (VITAMIN D) 50 MCG (2000 UT) CAPS Take 1 capsule (2,000 Units total) by mouth daily. 30  capsule   . famotidine (PEPCID) 20 MG tablet Take 1 tablet (20 mg total) by mouth at bedtime.    . fluticasone (FLONASE) 50 MCG/ACT nasal spray Place 1 spray into both nostrils daily.    . metoprolol tartrate (LOPRESSOR) 25 MG tablet Take 1.5 tablets (37.5 mg total) by mouth 2 (two) times daily. 270 tablet 1  . omeprazole (PRILOSEC) 40 MG capsule TAKE 1 CAPSULE BY MOUTH TWICE A DAY 180 capsule 2  . tizanidine (ZANAFLEX) 2 MG capsule Take 2 mg by mouth 2 (two) times daily.    . traZODone (DESYREL) 50 MG tablet Take 0.5-1 tablets (25-50 mg total) by mouth at bedtime as needed for sleep. 30 tablet 3   No facility-administered medications prior to visit.      Per HPI unless specifically indicated in ROS section below Review of Systems Objective:    BP 124/80 (BP Location: Left Arm, Patient Position: Sitting, Cuff Size: Normal)   Pulse (!) 101   Temp 97.7 F (36.5 C) (Temporal)   Ht 5\' 5"  (1.651 m)   Wt 182 lb 3 oz (82.6 kg)   SpO2 94%   BMI 30.32 kg/m   Wt Readings from Last 3 Encounters:  06/10/19 182 lb 3 oz (82.6 kg)  05/17/19 187 lb 6 oz (85 kg)  09/27/18 174 lb 2 oz (79 kg)    Physical Exam Vitals signs  and nursing note reviewed.  Constitutional:      General: He is not in acute distress.    Appearance: Normal appearance. He is not ill-appearing.     Comments: Ambulates with quad cane. Chronic R hemiparesis  HENT:     Head: Normocephalic and atraumatic.  Cardiovascular:     Rate and Rhythm: Normal rate and regular rhythm.     Pulses: Normal pulses.     Heart sounds: Normal heart sounds. No murmur.  Pulmonary:     Effort: Pulmonary effort is normal. No respiratory distress.     Breath sounds: Normal breath sounds. No wheezing, rhonchi or rales.  Musculoskeletal:     Right lower leg: No edema.     Left lower leg: No edema.  Neurological:     Mental Status: He is alert.  Psychiatric:        Mood and Affect: Mood normal.        Behavior: Behavior normal.        Results for orders placed or performed in visit on 05/17/19  PSA  Result Value Ref Range   PSA 0.74 0.10 - 4.00 ng/mL  Lipid panel  Result Value Ref Range   Cholesterol 168 0 - 200 mg/dL   Triglycerides 375.0 (H) 0.0 - 149.0 mg/dL   HDL 27.90 (L) >39.00 mg/dL   VLDL 75.0 (H) 0.0 - 40.0 mg/dL   Total CHOL/HDL Ratio 6    NonHDL 140.26   Comprehensive metabolic panel  Result Value Ref Range   Sodium 140 135 - 145 mEq/L   Potassium 4.3 3.5 - 5.1 mEq/L   Chloride 103 96 - 112 mEq/L   CO2 28 19 - 32 mEq/L   Glucose, Bld 119 (H) 70 - 99 mg/dL   BUN 11 6 - 23 mg/dL   Creatinine, Ser 0.85 0.40 - 1.50 mg/dL   Total Bilirubin 0.4 0.2 - 1.2 mg/dL   Alkaline Phosphatase 107 39 - 117 U/L   AST 16 0 - 37 U/L   ALT 19 0 - 53 U/L   Total Protein 6.5 6.0 - 8.3 g/dL   Albumin 4.0 3.5 - 5.2 g/dL   Calcium 8.9 8.4 - 10.5 mg/dL   GFR 93.99 >60.00 mL/min  TSH  Result Value Ref Range   TSH 2.92 0.35 - 4.50 uIU/mL  VITAMIN D 25 Hydroxy (Vit-D Deficiency, Fractures)  Result Value Ref Range   VITD 13.97 (L) 30.00 - 100.00 ng/mL  Microalbumin / creatinine urine ratio  Result Value Ref Range   Microalb, Ur 1.3 0.0 - 1.9 mg/dL   Creatinine,U 207.9 mg/dL   Microalb Creat Ratio 0.6 0.0 - 30.0 mg/g  LDL cholesterol, direct  Result Value Ref Range   Direct LDL 90.0 mg/dL   Assessment & Plan:  This visit occurred during the SARS-CoV-2 public health emergency.  Safety protocols were in place, including screening questions prior to the visit, additional usage of staff PPE, and extensive cleaning of exam room while observing appropriate contact time as indicated for disinfecting solutions.   Problem List Items Addressed This Visit    Spastic hemiplegia ([place])   Essential hypertension    Chronic, better controlled with addition of amlodipine - will continue this. Overall feeling well.  He is also doing better after sinusitis treatment with doxycycline course.           No orders of the defined  types were placed in this encounter.  No orders of the defined types were placed in this encounter.  Patient Instructions  Blood pressures are looking wonderful! Keep up the good work.  Continue both metoprolol and amlodipine.  I'm glad you're doing better. Keep follow up appointment in April.    Follow up plan: Return if symptoms worsen or fail to improve.  Ria Bush, MD

## 2019-08-24 ENCOUNTER — Telehealth: Payer: Self-pay

## 2019-08-24 NOTE — Telephone Encounter (Addendum)
Returned call, left message with receptionist.

## 2019-08-24 NOTE — Telephone Encounter (Signed)
Leann from [facility] [employer] needs to speak to his PCP in clarity over the pt's medical issue. Would like to speak to Dr Danise Mina within the next few weeks. Sooner is better, if possible. PLease call Marylin Crosby [phone]

## 2019-08-25 NOTE — Telephone Encounter (Signed)
Called back, spoke with Leann.

## 2019-08-25 NOTE — Telephone Encounter (Signed)
Leann returned your call. She would like a call back when you can

## 2019-11-15 ENCOUNTER — Ambulatory Visit: Payer: Self-pay | Admitting: Family Medicine

## 2019-12-21 ENCOUNTER — Other Ambulatory Visit: Payer: Self-pay | Admitting: Family Medicine

## 2020-02-11 IMAGING — MR MR LUMBAR SPINE W/O CM
4 of 5 series · 27 of 48 positions shown · non-contrast
Comparison: None available.

CLINICAL DATA: Initial evaluation for low back pain extending into
bilateral hips for 3 months.

EXAM:
MRI LUMBAR SPINE WITHOUT CONTRAST
TECHNIQUE: Multiplanar, multisequence MR imaging of the lumbar spine was
performed. No intravenous contrast was administered.

[Series 3: T2 post-contrast · sagittal · 4.0mm · 0.55mm/px · 4 of 15 slices shown]
[im 1/15]
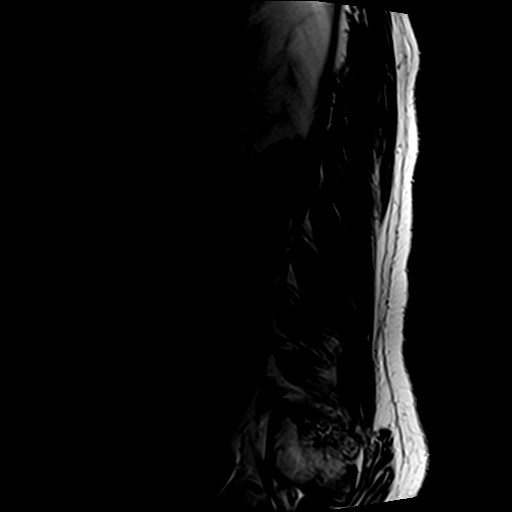
[im 5/15]
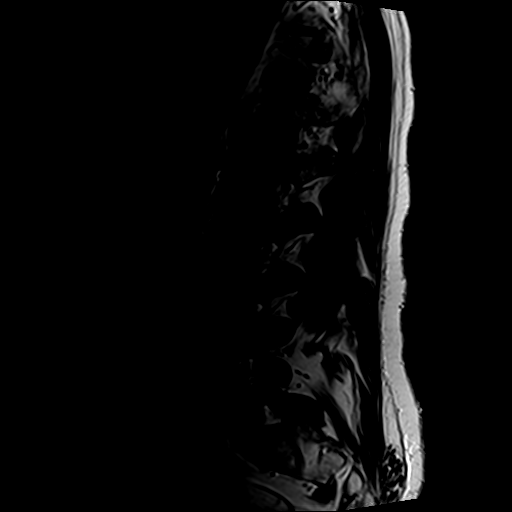
[im 10/15]
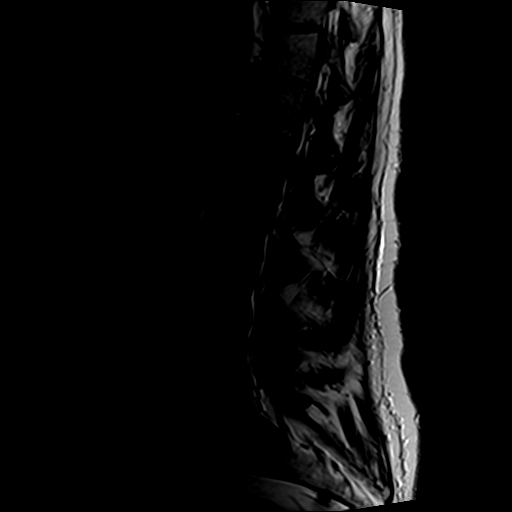
[im 15/15]
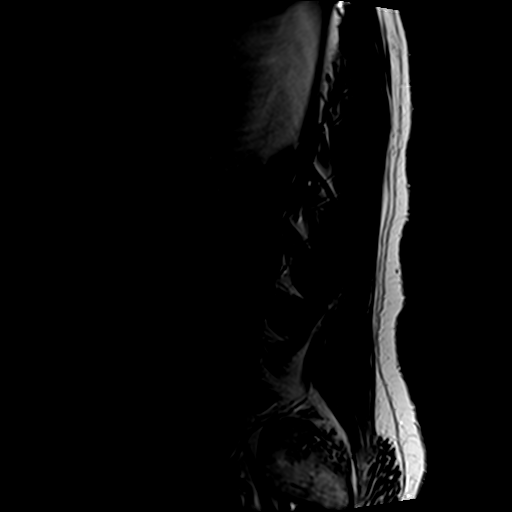

[Series 5: T1 · sagittal · 4.0mm · 0.55mm/px · 5 of 15 slices shown (1 of 2)]
[im 1/15]
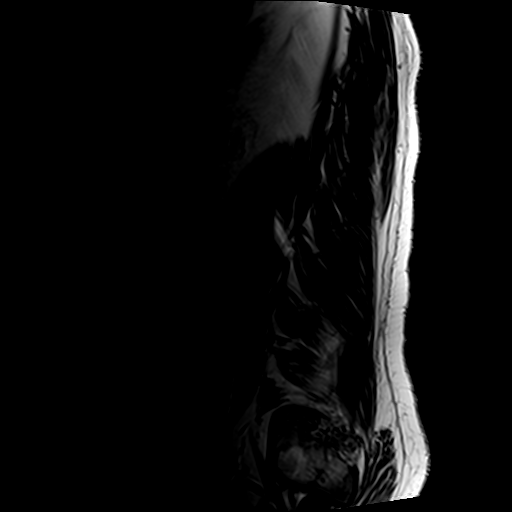
[im 4/15]
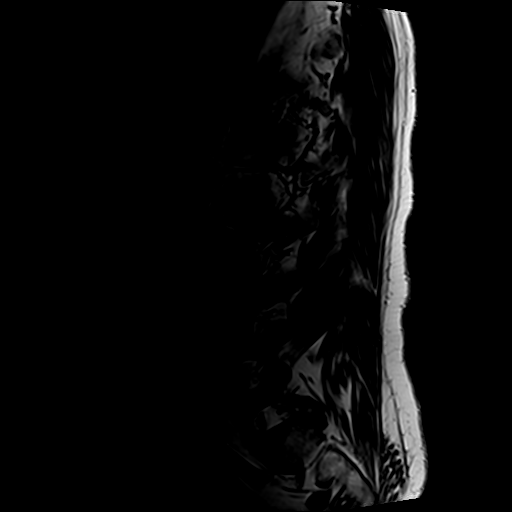
[im 8/15]
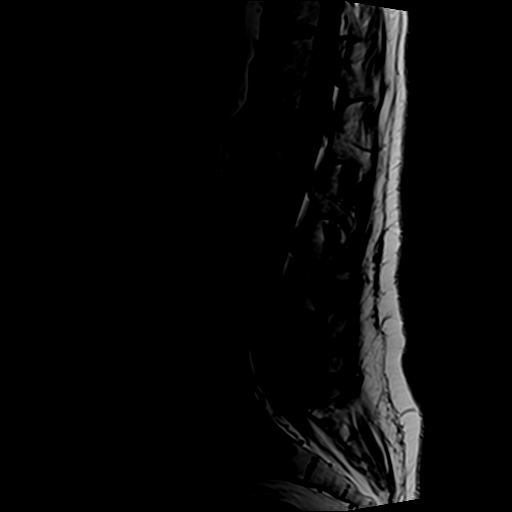
[im 11/15]
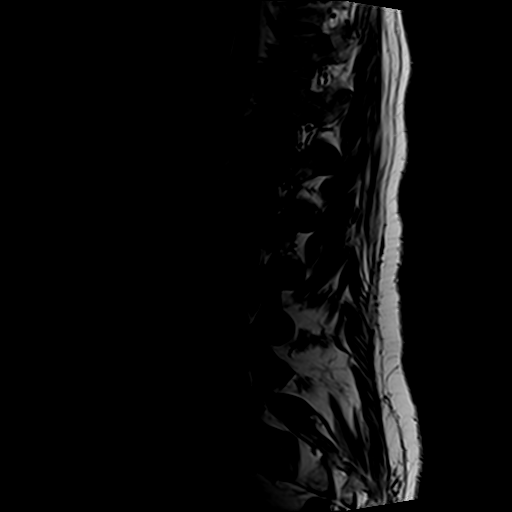
[im 15/15]
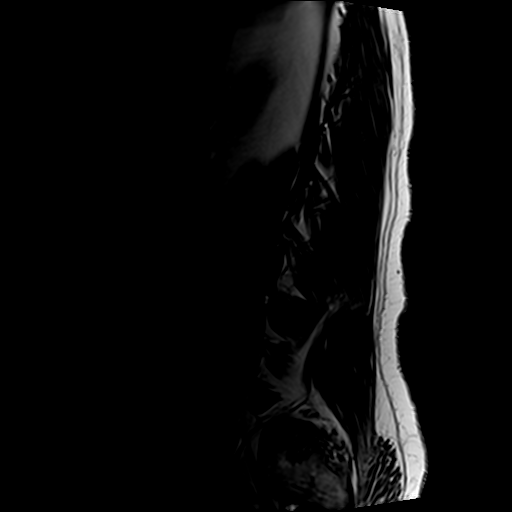

[Series 6: T1 · axial · 4.0mm · 0.35mm/px · z∈[-144,+85]mm · 7 of 51 slices shown (2 of 2)]
[im 4/51]
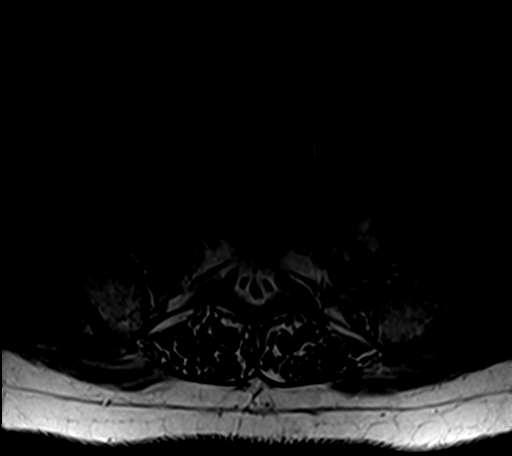
[im 7/51]
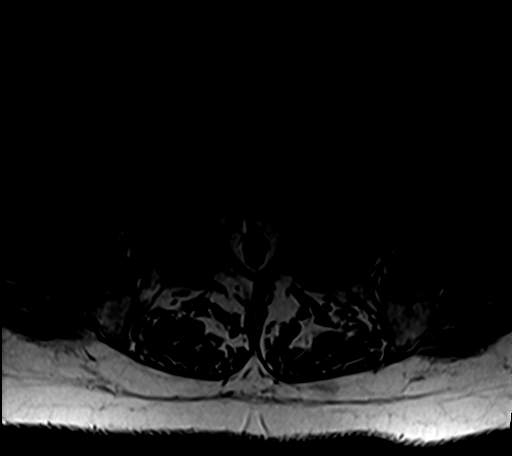
[im 10/51]
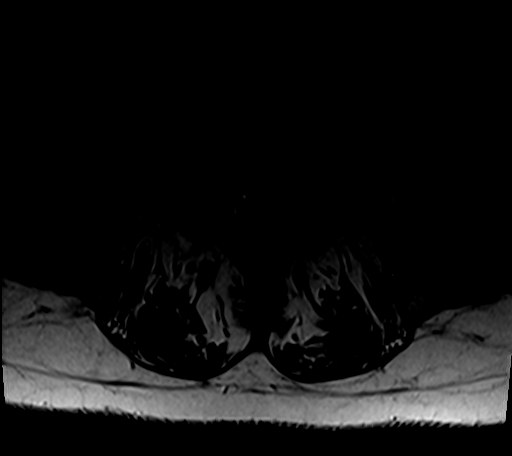
[im 16/51]
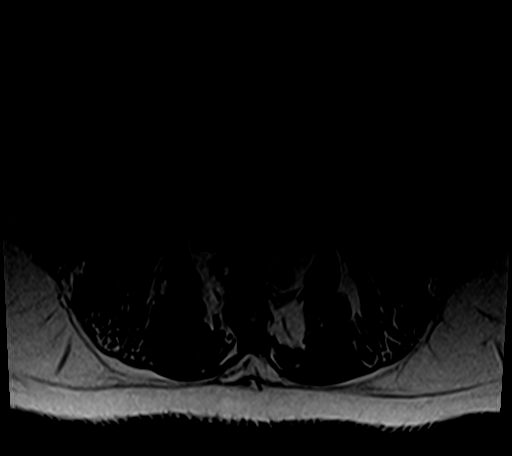
[im 22/51]
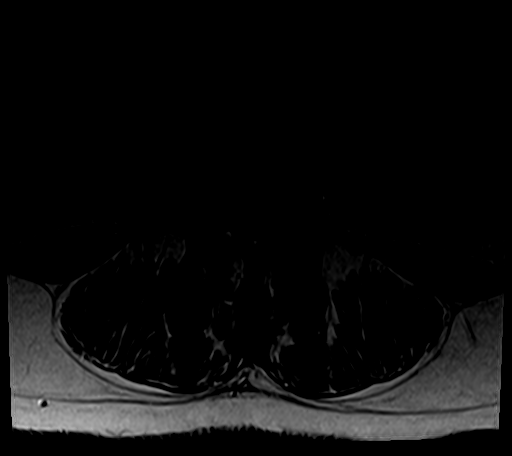
[im 26/51]
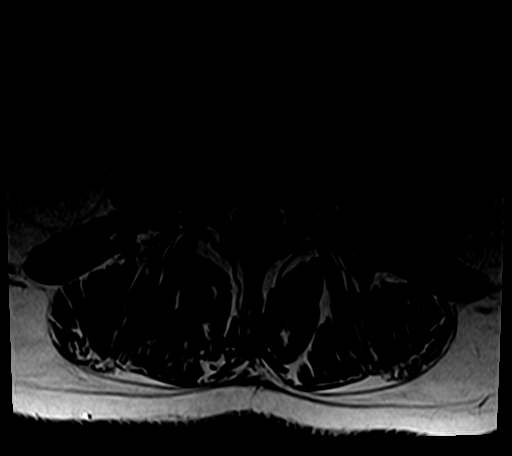
[im 44/51]
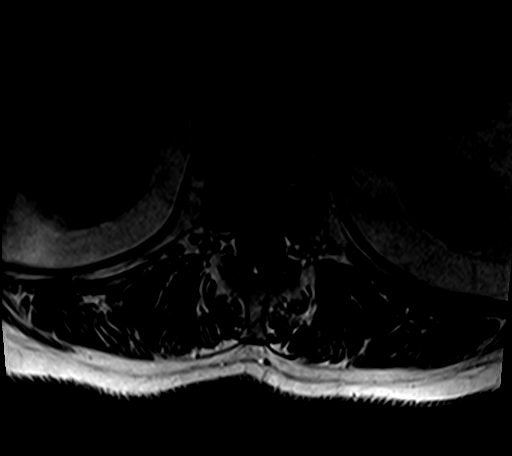

[Series 7: T2 · axial · 4.0mm · 0.70mm/px · z∈[-144,+100]mm · 11 of 51 slices shown]
[im 4/51]
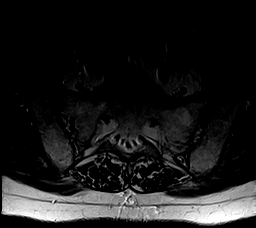
[im 7/51]
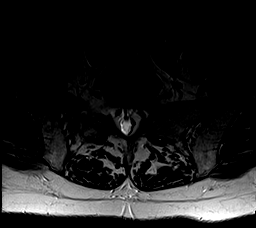
[im 10/51]
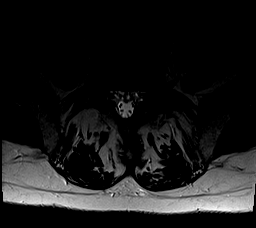
[im 16/51]
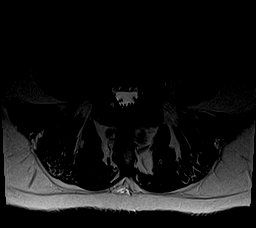
[im 22/51]
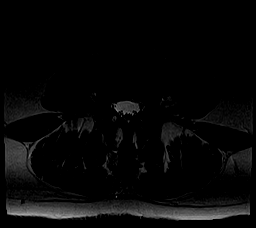
[im 26/51]
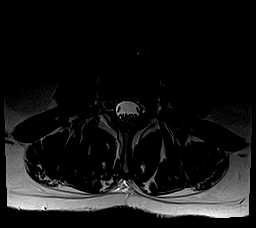
[im 29/51]
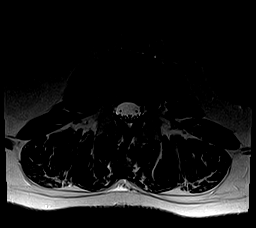
[im 35/51]
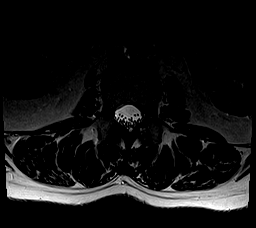
[im 41/51]
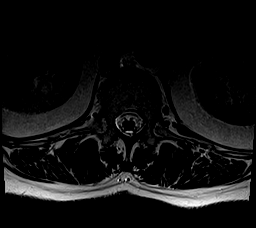
[im 44/51]
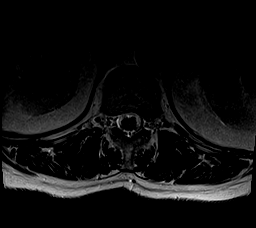
[im 47/51]
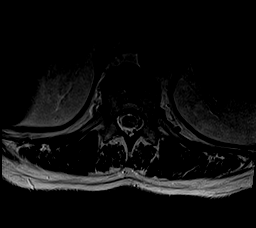

[27 of 48 positions shown; findings below may reference images not displayed]

FINDINGS: Segmentation: Examination mildly degraded by motion artifact.
Transitional lumbosacral anatomy with partial sacralization of the
L5 vertebral body. L5-S1 disc somewhat rudimentary.

Alignment: Trace retrolisthesis of L3 on L4. Alignment otherwise
normal with preservation of the normal lumbar lordosis.

Vertebrae: Vertebral body heights well maintained without evidence
for acute or chronic fracture. Bone marrow signal intensity within
normal limits. No discrete or worrisome osseous lesions. No abnormal
marrow edema. Mild reactive endplate changes about the T10-11
interspace noted.

Conus medullaris and cauda equina: Conus extends to the T12-L1
level. Conus and cauda equina appear normal.

Paraspinal and other soft tissues: Paraspinous soft tissues within
normal limits. Visualized visceral structures are normal.

Disc levels:

T12-L1: Mild disc bulge.  No stenosis or impingement.

L1-2: Mild annular disc bulge.  No stenosis.

L2-3:  Mild annular disc bulge.  No stenosis.

L3-4: Trace retrolisthesis. Minimal disc bulge. Mild bilateral facet
hypertrophy. No stenosis.

L4-5: Mild annular disc bulge. Mild bilateral facet hypertrophy. No
significant canal or foraminal stenosis.

L5-S1: Transitional lumbosacral anatomy with rudimentary L5-S1 disc.
No disc bulge or disc protrusion. Moderate bilateral facet
hypertrophy. No stenosis or impingement.
IMPRESSION: 1. Minor for age multilevel disc bulging throughout the lumbar
spine. No significant stenosis or neural impingement.
2. Moderate bilateral facet hypertrophy at L5-S1, with more mild
facet degeneration at L3-4 and L4-5.
3. Transitional lumbosacral anatomy.

## 2020-05-09 ENCOUNTER — Other Ambulatory Visit: Payer: Self-pay | Admitting: Family Medicine

## 2020-05-09 NOTE — Telephone Encounter (Signed)
E-scribed refill.  Plz schedule CPE and lab visits. 

## 2020-05-15 NOTE — Telephone Encounter (Signed)
Patient refused cpe and labs. States that he has changed practices due to insurance coverage.

## 2020-05-15 NOTE — Telephone Encounter (Signed)
Noted. May remove me as PCP.

## 2020-09-20 ENCOUNTER — Other Ambulatory Visit: Payer: Self-pay | Admitting: Family Medicine

## 2020-09-24 ENCOUNTER — Other Ambulatory Visit: Payer: Self-pay | Admitting: Family Medicine
